# Patient Record
Sex: Female | Born: 1975 | Race: Black or African American | Hispanic: No | Marital: Single | Smoking: Never smoker
Health system: Southern US, Community
[De-identification: ages and names within clinical notes are randomized; demographics above are authoritative.]

## PROBLEM LIST (undated history)

## (undated) DIAGNOSIS — D649 Anemia, unspecified: Secondary | ICD-10-CM

## (undated) DIAGNOSIS — I10 Essential (primary) hypertension: Secondary | ICD-10-CM

## (undated) DIAGNOSIS — Q613 Polycystic kidney, unspecified: Secondary | ICD-10-CM

## (undated) HISTORY — PX: PATELLA RECONSTRUCTION: SHX736

---

## 2015-08-10 ENCOUNTER — Encounter: Payer: Self-pay | Admitting: Emergency Medicine

## 2015-08-10 ENCOUNTER — Ambulatory Visit
Admission: EM | Admit: 2015-08-10 | Discharge: 2015-08-10 | Disposition: A | Discharge status: Home / Self Care | Payer: BC Managed Care – PPO | Attending: Family Medicine | Admitting: Family Medicine

## 2015-08-10 DIAGNOSIS — L03116 Cellulitis of left lower limb: Secondary | ICD-10-CM

## 2015-08-10 DIAGNOSIS — T63441A Toxic effect of venom of bees, accidental (unintentional), initial encounter: Secondary | ICD-10-CM

## 2015-08-10 DIAGNOSIS — B070 Plantar wart: Secondary | ICD-10-CM

## 2015-08-10 HISTORY — DX: Essential (primary) hypertension: I10

## 2015-08-10 MED ORDER — MUPIROCIN CALCIUM 2 % EX CREA: 1.0000 "application " | TOPICAL_CREAM | Freq: Two times a day (BID) | CUTANEOUS | Status: AC

## 2015-08-10 MED ORDER — SULFAMETHOXAZOLE-TRIMETHOPRIM 800-160 MG PO TABS: 1.0000 | ORAL_TABLET | Freq: Two times a day (BID) | ORAL | Status: DC

## 2015-08-10 MED ORDER — ACETAMINOPHEN 500 MG PO TABS: 1000.0000 mg | ORAL_TABLET | Freq: Four times a day (QID) | ORAL | Status: DC | PRN

## 2015-08-10 MED ORDER — DIPHENHYDRAMINE HCL 50 MG PO CAPS: ORAL_CAPSULE | ORAL | Status: DC

## 2015-08-10 MED ORDER — SALICYLIC ACID 0.5 % EX LIQD: 2.0000 [drp] | Freq: Every day | CUTANEOUS | Status: AC

## 2015-08-10 MED ORDER — IBUPROFEN 800 MG PO TABS: 800.0000 mg | ORAL_TABLET | Freq: Three times a day (TID) | ORAL | Status: DC

## 2015-08-10 NOTE — Discharge Instructions (Signed)
Bee, Wasp, or [employer], wasps, and hornets are part of a family of insects that can sting people. These stings can cause pain and inflammation, but they are usually not serious. However, some people may have an allergic reaction to a sting. This can cause the symptoms to be more severe.  SYMPTOMS  Common symptoms of this condition include:   A red lump in the skin that sometimes has a tiny hole in the center. In some cases, a stinger may be in the center of the wound.  Pain and itching at the sting site.  Redness and swelling around the sting site. If you have an allergic reaction (localized allergic reaction), the swelling and redness may spread out from the sting site. In some cases, this reaction can continue to develop over the next 12-36 hours. In rare cases, a person may have a severe allergic reaction (anaphylactic reaction) to a sting. Symptoms of an anaphylactic reaction may include:   Wheezing or difficulty breathing.  Raised, itchy, red patches on the skin.  Nausea or vomiting.  Abdominal cramping.  Diarrhea.  Chest pain.  Fainting.  Redness of the face (flushing). DIAGNOSIS  This condition is usually diagnosed based on symptoms, medical history, and a physical exam. TREATMENT  Most stings can be treated with:   Icing to reduce swelling.  Medicines (antihistamines) to treat itching or an allergic reaction.  Medicines to help reduce pain. These may be medicines that you take by mouth, or medicated creams or lotions that you apply to your skin. If you were stung by a bee, the stinger and a small sac of poison may be in the wound. This may be removed by brushing across it with a flat card, such as a credit card. Another method is to pinch the area and pull it out. These methods can help reduce the severity of the body's reaction to the sting.  HOME CARE INSTRUCTIONS   Wash the sting site daily with soap and water as told by your health care provider.  Apply  or take over-the-counter and prescription medicines only as told by your health care provider.  If directed, apply ice to the sting area.  Put ice in a plastic bag.  Place a towel between your skin and the bag.  Leave the ice on for 20 minutes, 2-3 times per day.  Do not scratch the sting area.  To lessen pain, try using a paste that is made of water and baking soda. Rub the paste on the sting area and leave it on for 5 minutes.  If you had a severe allergic reaction to a sting, you may need:  To wear a medical bracelet or necklace that lists the allergy.  To learn when and how to use an anaphylaxis kit or epinephrine injection. Your family members may also need to learn this.  To carry an anaphylaxis kit with you at all times. SEEK MEDICAL CARE IF:   Your symptoms do not get better in 2-3 days.  You have redness, swelling, or pain that spreads beyond the area of the sting.  You have a fever. SEEK IMMEDIATE MEDICAL CARE IF:  You have symptoms of a severe allergic reaction. These include:   Wheezing or difficulty breathing.  Chest pain.  Light-headedness or fainting.  Itchy, raised, red patches on the skin.  Nausea or vomiting.  Abdominal cramping.  Diarrhea.   This information is not intended to replace advice given to you by your health care provider.  Make sure you discuss any questions you have with your health care provider.   Document Released: 10/03/2005 Document Revised: 06/24/2015 Document Reviewed: 02/18/2015 Elsevier Interactive Patient Education 2016 Elsevier Inc. Plantar Warts Warts are small growths on the skin. They can occur on various areas of the body. When they occur on the underside (sole) of the foot, they are called plantar warts. Plantar warts often occur in groups, with several small warts around a larger growth. They tend to develop over areas of pressure, such as the heel or the ball of the foot. Most warts are not painful, and they  usually do not cause problems. However, plantar warts may cause pain when you walk because pressure is applied to them. Warts often go away on their own in time. Various treatments may be done if needed. Sometimes, warts go away and then they come back again. CAUSES Plantar warts are caused by a type of virus that is called human papillomavirus (HPV). HPV attacks a break in the skin of the foot. Walking barefoot can lead to exposure to the virus. These warts may spread to other areas of the sole. They spread to other areas of the body only through direct contact. RISK FACTORS Plantar warts are more likely to develop in:  People who are 11-69 years of age.  People who use public showers or locker rooms.  People who have a weakened body defense system (immune system). SYMPTOMS Plantar warts may be flat or slightly raised. They may grow into the deeper layers of skin or rise above the surface of the skin. Most plantar warts have a rough surface. They may cause pain when you use your foot to support your body weight. DIAGNOSIS A plantar wart can usually be diagnosed from its appearance. In some cases, a tissue sample may be removed (biopsy) to be looked at under a microscope. TREATMENT In many cases, warts do not need treatment. Without treatment, they often go away over a period of many months to a couple years. If treatment is needed, options may include:  Applying medicated solutions, creams, or patches to the wart. These may be over-the-counter or prescription medicines that make the skin soft so that layers will gradually shed away. In many cases, the medicine is applied one or two times per day and covered with a bandage.  Putting duct tape over the top of the wart (occlusion). You will leave the tape in place for as long as told by your health care provider, then you will replace it with a new strip of tape. This is done until the wart goes away.  Freezing the wart with liquid nitrogen  (cryotherapy).  Burning the wart with:  Laser treatment.  An electrified probe (electrocautery).  Injection of a medicine (Candida antigen) into the wart to help the body's immune system to fight off the wart.  Surgery to remove the wart. HOME CARE INSTRUCTIONS  Apply medicated creams or solutions only as told by your health care provider. This may involve:  Soaking the affected area in warm water.  Removing the top layer of softened skin before you apply the medicine. A pumice stone works well for removing the tissue.  Applying a bandage over the affected area after you apply the medicine.  Repeating the process daily or as told by your health care provider.  Do not scratch or pick at a wart.  Wash your hands after you touch a wart.  If a wart is painful, try applying a bandage with  a hole in the middle over the wart. The helps to take pressure off the wart.  Keep all follow-up visits as told by your health care provider. This is important. PREVENTION Take these actions to help prevent warts:  Wear shoes and socks. Change your socks daily.  Keep your feet clean and dry.  Check your feet regularly.  Avoid direct contact with warts on other people. SEEK MEDICAL CARE IF:  Your warts do not improve after treatment.  You have redness, swelling, or pain at the site of a wart.  You have bleeding from a wart that does not stop with light pressure.  You have diabetes and you develop a wart.   This information is not intended to replace advice given to you by your health care provider. Make sure you discuss any questions you have with your health care provider.   Document Released: 12/24/2003 Document Revised: 06/24/2015 Document Reviewed: 12/29/2014 Elsevier Interactive Patient Education 2016 Elsevier Inc. Cellulitis Cellulitis is an infection of the skin and the tissue beneath it. The infected area is usually red and tender. Cellulitis occurs most often in the arms  and lower legs.  CAUSES  Cellulitis is caused by bacteria that enter the skin through cracks or cuts in the skin. The most common types of bacteria that cause cellulitis are staphylococci and streptococci. SIGNS AND SYMPTOMS   Redness and warmth.  Swelling.  Tenderness or pain.  Fever. DIAGNOSIS  Your health care provider can usually determine what is wrong based on a physical exam. Blood tests may also be done. TREATMENT  Treatment usually involves taking an antibiotic medicine. HOME CARE INSTRUCTIONS   Take your antibiotic medicine as directed by your health care provider. Finish the antibiotic even if you start to feel better.  Keep the infected arm or leg elevated to reduce swelling.  Apply a warm cloth to the affected area up to 4 times per day to relieve pain.  Take medicines only as directed by your health care provider.  Keep all follow-up visits as directed by your health care provider. SEEK MEDICAL CARE IF:   You notice red streaks coming from the infected area.  Your red area gets larger or turns dark in color.  Your bone or joint underneath the infected area becomes painful after the skin has healed.  Your infection returns in the same area or another area.  You notice a swollen bump in the infected area.  You develop new symptoms.  You have a fever. SEEK IMMEDIATE MEDICAL CARE IF:   You feel very sleepy.  You develop vomiting or diarrhea.  You have a general ill feeling (malaise) with muscle aches and pains.   This information is not intended to replace advice given to you by your health care provider. Make sure you discuss any questions you have with your health care provider.   Document Released: 07/13/2005 Document Revised: 06/24/2015 Document Reviewed: 12/19/2011 Elsevier Interactive Patient Education [employer].

## 2015-08-10 NOTE — ED Notes (Signed)
Patient states she has had a knot on her right foot for several weeks. Patient went to PCP and had it "frozen" 2 weeks ago but it seems to be  Getting worse.

## 2015-08-10 NOTE — ED Provider Notes (Signed)
CSN: [id number]     Arrival date & time 08/10/15  0820 History   First MD Initiated Contact with Patient 08/10/15 [phone]     Chief Complaint  Patient presents with  . 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  [facility] skin turned black and now swollen/red/painful to bear weight.  Tried a wart moleskin this weekend helped a little but redness extending sole right foot "feels like a knot" under skin.  Was stung by bee yesterday left forearm wants me to check if stinger gone it flew up her sleeve and she tried to shake it out but it stung her. Some swelling and redness noted by patient.  Denied allergy to iodine PSHx c-section x 4; M, F-HTN, F - colon cancer, PMHx HTN, leaky heart valve  The history is provided by the patient.    Past Medical History  Diagnosis Date  . Hypertension    Past Surgical History  Procedure Laterality Date  . Cesarean section classical      x 4    History reviewed. No pertinent family history. Social History  Substance Use Topics  . Smoking status: Never Smoker   . Smokeless tobacco: None  . Alcohol Use: No   OB History    No data available     Review of Systems  Constitutional: Negative for fever, chills, diaphoresis, activity change, appetite change, fatigue and unexpected weight change.  HENT: Negative for congestion, dental problem, drooling, ear discharge, ear pain, facial swelling, hearing loss, mouth sores, nosebleeds, postnasal drip, rhinorrhea, trouble swallowing and voice change.   Eyes: Negative for photophobia, pain, discharge, redness, itching and visual disturbance.  Respiratory: Negative for cough, choking, chest tightness, shortness of breath, wheezing and stridor.   Cardiovascular:  Negative for chest pain, palpitations and leg swelling.  Gastrointestinal: Negative for nausea, vomiting, abdominal pain, diarrhea, constipation, blood in stool and abdominal distention.  Endocrine: Negative for cold intolerance and heat intolerance.  Genitourinary: Negative for dysuria.  Musculoskeletal: Positive for myalgias. Negative for back pain, joint swelling, arthralgias, gait problem, neck pain and neck stiffness.  Skin: Positive for color change and rash. Negative for pallor and wound.  Allergic/Immunologic: Positive for food allergies. Negative for environmental allergies.  Neurological: Negative for dizziness, tremors, seizures, syncope, facial asymmetry, speech difficulty, weakness, light-headedness, numbness and headaches.  Hematological: Negative for adenopathy. Does not bruise/bleed easily.  Psychiatric/Behavioral: Negative for behavioral problems, confusion, sleep disturbance and agitation.    Allergies  Darvon; Penicillins; and Shellfish allergy  Home Medications   Prior to Admission medications   Medication Sig Start Date End Date Taking? Authorizing Provider  acetaminophen (TYLENOL) 500 MG tablet Take 2 tablets (1,000 mg total) by mouth every 6 (six) hours as needed for moderate pain. 08/10/15   Barbaraann Barthelina A Alfonza Toft, NP  diphenhydrAMINE (BENADRYL) 50 MG capsule 0.5 - 1 cap po TID prn itching 08/10/15   Barbaraann Barthelina A Kaydee Magel, NP  ibuprofen (ADVIL,MOTRIN) 800 MG tablet Take 1 tablet (800 mg total) by mouth 3 (three) times daily. 08/10/15   Barbaraann Barthelina A Salik Grewell, NP  mupirocin cream (BACTROBAN) 2 % Apply 1 application topically 2 (two) times daily. 08/10/15 08/17/15  Barbaraann Barthelina A Jayanth Szczesniak, NP  Salicylic Acid 0.5 % LIQD Apply 2 drops topically daily. 08/10/15 08/17/15  Barbaraann Barthelina A Kannen Moxey, NP  sulfamethoxazole-trimethoprim (BACTRIM DS,SEPTRA DS)  800-160 MG tablet Take 1 tablet by mouth 2 (two) times daily. 08/10/15   Barbaraann Barthel, NP   Meds Ordered and Administered this Visit   Medications - No data to display  BP 123/81 mmHg  Pulse 75  Temp(Src) 98.2 F (36.8 C) (Tympanic)  Resp 16  Ht  (1.626 m)  Wt 186 lb (84.369 kg)  BMI 31.91 kg/m2  SpO2 100%  LMP  (LMP Unknown) No data found.   Physical Exam  Constitutional: She is oriented to person, place, and time. Vital signs are normal. She appears well-developed and well-nourished. She is active and cooperative.  Non-toxic appearance. She does not have a sickly appearance. She does not appear ill. No distress.  HENT:  Head: Normocephalic and atraumatic.  Right Ear: Hearing and external ear normal.  Left Ear: Hearing and external ear normal.  Nose: Nose normal. No mucosal edema, rhinorrhea or nose lacerations.  Mouth/Throat: Uvula is midline, oropharynx is clear and moist and mucous membranes are normal. Mucous membranes are not pale, not dry and not cyanotic. She does not have dentures. No oral lesions. No trismus in the jaw. Normal dentition. No dental abscesses, uvula swelling, lacerations or dental caries. No oropharyngeal exudate, posterior oropharyngeal edema, posterior oropharyngeal erythema or tonsillar abscesses.  Eyes: Conjunctivae, EOM and lids are normal. Pupils are equal, round, and reactive to light. Right eye exhibits no chemosis, no discharge, no exudate and no hordeolum. No foreign body present in the right eye. Left eye exhibits no chemosis, no discharge, no exudate and no hordeolum. No foreign body present in the left eye. Right conjunctiva is not injected. Right conjunctiva has no hemorrhage. Left conjunctiva is not injected. Left conjunctiva has no hemorrhage. No scleral icterus. Right eye exhibits normal extraocular motion and no nystagmus. Left eye exhibits normal extraocular motion and no nystagmus. Right pupil is round and reactive. Left pupil is round and reactive. Pupils are equal.  Neck: Trachea normal and normal range of motion. Neck supple. No rigidity. No tracheal deviation, no edema,  no erythema and normal range of motion present. No thyromegaly present.  Cardiovascular: Normal rate, regular rhythm, normal heart sounds and intact distal pulses.  Exam reveals no gallop and no friction rub.   No murmur heard. Pulses:      Dorsalis pedis pulses are 2+ on the right side, and 2+ on the left side.  Pulmonary/Chest: Effort normal and breath sounds normal. No accessory muscle usage or stridor. No respiratory distress. She has no decreased breath sounds. She has no wheezes. She has no rhonchi. She has no rales. She exhibits no tenderness.  Abdominal: Soft. She exhibits no distension.  Musculoskeletal: Normal range of motion. She exhibits edema and tenderness.       Right knee: Normal.       Left knee: Normal.       Right ankle: Normal.       Left ankle: Normal.       Right lower leg: Normal.       Left lower leg: Normal.       Right foot: There is tenderness and swelling. There is normal range of motion, no bony tenderness, normal capillary refill, no crepitus, no deformity and no laceration.       Left foot: Normal.  Plantar MTP joint 1st with calloused area punctuate center and dark dots surrounded by 1cm erythema macular TTP right foot; erythematous area warm to touch  Neurological: She is alert and oriented to person, place, and  time. She displays no atrophy and no tremor. No cranial nerve deficit or sensory deficit. She exhibits normal muscle tone. She displays no seizure activity. Coordination and gait normal. GCS eye subscore is 4. GCS verbal subscore is 5. GCS motor subscore is 6.  Skin: Skin is warm, dry and intact. Rash noted. No abrasion, no bruising, no burn, no ecchymosis, no laceration, no lesion, no petechiae and no purpura noted. Rash is macular. Rash is not papular, not maculopapular, not nodular, not pustular, not vesicular and not urticarial. She is not diaphoretic. There is erythema. No cyanosis. No pallor. Nails show no clubbing.  Fine scale centrally with punctate  right plantar foot  Psychiatric: She has a normal mood and affect. Her speech is normal and behavior is normal. Judgment and thought content normal. Cognition and memory are normal.  Nursing note and vitals reviewed.   ED Course  Procedures (including critical care time)  Labs Review Labs Reviewed - No data to display  Imaging Review No results found.  0945 shaved down callous right foot with 10 blade after cleansing with iodine.  Stopped due to patient discomfort before capillary bleeding noted and triple antibiotic and bandaid applied.  Discussed Bandaid Change prn soiling and daily with shower/after work.  Patient verbalized understanding of information/instructions, agreed with plan of care and had no further questions at this time.     MDM   1. Bee sting, accidental or unintentional, initial encounter   2. Plantar wart of right foot   3. Cellulitis of left lower extremity   Benadryl 25-50mg  po pm prn breaththrough itching otherwise zyrtec  po qam.  Symptomatic therapy suggested.  Warm to cool water soaks or tepid showers.  Call or return to clinic as needed if these symptoms worsen or fail to improve as anticipated.  Go to ER if difficulty breathing, swallowing, dizziness or chest pain for immediate evaluation and treatment follow up with [facility] if ER visit required.   Discussed signs/symptoms infection e.g. Fever, chills, worsening redness, swelling.  Exitcare handout on insect bite/sting, cellulitis given to patient. Discussed how to remove hornet/bee stinger if sting reoccurs in future  Patient verbalized agreement and understanding of treatment plan and had no further questions at this time.   P2:  Avoidance and hand washing.  Work excuse x 24 hours due to cellulitis, elevate, ice tylenol  po QID prn.  Use OTC salacytic acid daily until resolved typically 1 week or follow up with PCM for repeat liquid nitrogen treatment discussed with patient we do not have liquid nitrogen.   Treated with shaving callous with #10 blade until pinpoint of blood noted and slight tenderness. Triple antibiotic and Band-Aid applied.  Patient reported minimal pain after procedure.  Discussed with patient to keep clean and dry and follow up in 7-10 days with PCM if wart growing larger or not resolved for cryotherapy/retreatment.  Discussed with patient 2-3 treatments may be required to eradicate wart.  Exitcare handout on warts given to patient. Patient notified to expect scab at treated area but if erythema, purulent drainage, red streak radiating up hand/foot to return to clinic for re-evaluation. Patient verbalized understanding of instructions/information and had no further questions at this time.  Had liquid nitrogen treatment at [facility] was wearing moleskin left foot now red, swollen TTP.  Exitcare handout on skin infection given to patient.  Start bactrim DS po BID and bactroban 2% apply affected area BID x 7 days.  RTC if worsening erythema, pain, purulent discharge, fever  despite antibiotics.  Wash towels, washcloths, sheets in hot water with bleach every couple of days until infection resolved.  Patient verbalized understanding, agreed with plan of care and had no further questions at this time.     Barbaraann Barthel, NP 08/10/15 1451

## 2015-09-22 ENCOUNTER — Emergency Department
Admission: EM | Admit: 2015-09-22 | Discharge: 2015-09-22 | Disposition: A | Discharge status: Home / Self Care | Payer: BC Managed Care – PPO | Attending: Emergency Medicine | Admitting: Emergency Medicine

## 2015-09-22 ENCOUNTER — Encounter: Payer: Self-pay | Admitting: Medical Oncology

## 2015-09-22 DIAGNOSIS — Z791 Long term (current) use of non-steroidal anti-inflammatories (NSAID): Secondary | ICD-10-CM | POA: Insufficient documentation

## 2015-09-22 DIAGNOSIS — B070 Plantar wart: Secondary | ICD-10-CM

## 2015-09-22 DIAGNOSIS — I10 Essential (primary) hypertension: Secondary | ICD-10-CM | POA: Diagnosis not present

## 2015-09-22 DIAGNOSIS — Z792 Long term (current) use of antibiotics: Secondary | ICD-10-CM | POA: Diagnosis not present

## 2015-09-22 DIAGNOSIS — M79671 Pain in right foot: Secondary | ICD-10-CM | POA: Diagnosis present

## 2015-09-22 DIAGNOSIS — Z880 Allergy status to penicillin: Secondary | ICD-10-CM | POA: Insufficient documentation

## 2015-09-22 NOTE — Discharge Instructions (Signed)
Plantar Warts Warts are small growths on the skin. They can occur on various areas of the body. When they occur on the underside (sole) of the foot, they are called plantar warts. Plantar warts often occur in groups, with several small warts around a larger growth. They tend to develop over areas of pressure, such as the heel or the ball of the foot. Most warts are not painful, and they usually do not cause problems. However, plantar warts may cause pain when you walk because pressure is applied to them. Warts often go away on their own in time. Various treatments may be done if needed. Sometimes, warts go away and then they come back again. CAUSES Plantar warts are caused by a type of virus that is called human papillomavirus (HPV). HPV attacks a break in the skin of the foot. Walking barefoot can lead to exposure to the virus. These warts may spread to other areas of the sole. They spread to other areas of the body only through direct contact. RISK FACTORS Plantar warts are more likely to develop in:  People who are 10-20 years of age.  People who use public showers or locker rooms.  People who have a weakened body defense system (immune system). SYMPTOMS Plantar warts may be flat or slightly raised. They may grow into the deeper layers of skin or rise above the surface of the skin. Most plantar warts have a rough surface. They may cause pain when you use your foot to support your body weight. DIAGNOSIS A plantar wart can usually be diagnosed from its appearance. In some cases, a tissue sample may be removed (biopsy) to be looked at under a microscope. TREATMENT In many cases, warts do not need treatment. Without treatment, they often go away over a period of many months to a couple years. If treatment is needed, options may include:  Applying medicated solutions, creams, or patches to the wart. These may be over-the-counter or prescription medicines that make the skin soft so that layers will  gradually shed away. In many cases, the medicine is applied one or two times per day and covered with a bandage.  Putting duct tape over the top of the wart (occlusion). You will leave the tape in place for as long as told by your health care provider, then you will replace it with a new strip of tape. This is done until the wart goes away.  Freezing the wart with liquid nitrogen (cryotherapy).  Burning the wart with:  Laser treatment.  An electrified probe (electrocautery).  Injection of a medicine (Candida antigen) into the wart to help the body's immune system to fight off the wart.  Surgery to remove the wart. HOME CARE INSTRUCTIONS  Apply medicated creams or solutions only as told by your health care provider. This may involve:  Soaking the affected area in warm water.  Removing the top layer of softened skin before you apply the medicine. A pumice stone works well for removing the tissue.  Applying a bandage over the affected area after you apply the medicine.  Repeating the process daily or as told by your health care provider.  Do not scratch or pick at a wart.  Wash your hands after you touch a wart.  If a wart is painful, try applying a bandage with a hole in the middle over the wart. The helps to take pressure off the wart.  Keep all follow-up visits as told by your health care provider. This is important. PREVENTION   Take these actions to help prevent warts:  Wear shoes and socks. Change your socks daily.  Keep your feet clean and dry.  Check your feet regularly.  Avoid direct contact with warts on other people. SEEK MEDICAL CARE IF:  Your warts do not improve after treatment.  You have redness, swelling, or pain at the site of a wart.  You have bleeding from a wart that does not stop with light pressure.  You have diabetes and you develop a wart.   This information is not intended to replace advice given to you by your health care provider. Make sure  you discuss any questions you have with your health care provider.   Document Released: 12/24/2003 Document Revised: 06/24/2015 Document Reviewed: 12/29/2014 Elsevier Interactive Patient Education 2016 [employer]  

## 2015-09-22 NOTE — ED Notes (Signed)
Pt reports she was treated for a wart on the bottom of her rt foot in October, shortly after pt was placed on abx for infection of wound, today pt reports she continues to have pain but no drainage noted. Denies fever. Pt pain is 7/10 without bearing weight. When bearing weight, pain is 10/10. Pt noted to have small placed on her right foot pad area with some swelling, no redness noted.

## 2015-09-22 NOTE — ED Notes (Signed)
Pt reports she was treated for a wart on the bottom of her rt foot in October, shortly after pt was placed on abx for infection of wound, today pt reports she continues to have pain but no drainage noted. Denies fever.

## 2015-09-22 NOTE — ED Provider Notes (Signed)
[facility] Emergency Department Provider Note  ____________________________________________  Time seen: Approximately 11:46 AM  I have reviewed the triage vital signs and the nursing notes.   HISTORY  Chief Complaint Foot Pain    HPI Sandia Short is a 39 y.o. female who presents emergency Department with a reoccurring plantar wart to the plantar aspect of right foot. She states that there was cryosurgery performed approximately a month ago and symptoms have returned. Patient is experiencing pain with direct pressure to area. She takes ibuprofen occasionally for symptom control. Patient states that she is unable to take ibuprofen every day due to her doctor's recommendation.   Past Medical History  Diagnosis Date  . Hypertension     There are no active problems to display for this patient.   Past Surgical History  Procedure Laterality Date  . Cesarean section classical      x 4     Current Outpatient Rx  Name  Route  Sig  Dispense  Refill  . acetaminophen (TYLENOL) 500 MG tablet   Oral   Take 2 tablets (1,000 mg total) by mouth every 6 (six) hours as needed for moderate pain.   30 tablet   0   . diphenhydrAMINE (BENADRYL) 50 MG capsule      0.5 - 1 cap po TID prn itching   30 capsule   0   . ibuprofen (ADVIL,MOTRIN) 800 MG tablet   Oral   Take 1 tablet (800 mg total) by mouth 3 (three) times daily.   21 tablet   0   . sulfamethoxazole-trimethoprim (BACTRIM DS,SEPTRA DS) 800-160 MG tablet   Oral   Take 1 tablet by mouth 2 (two) times daily.   14 tablet   0     Allergies Darvon; Penicillins; and Shellfish allergy  No family history on file.  Social History Social History  Substance Use Topics  . Smoking status: Never Smoker   . Smokeless tobacco: None  . Alcohol Use: No    Review of Systems Constitutional: No fever/chills Eyes: No visual changes. ENT: No sore throat. Cardiovascular: Denies chest pain. Respiratory:  Denies shortness of breath. Gastrointestinal: No abdominal pain.  No nausea, no vomiting.  No diarrhea.  No constipation. Genitourinary: Negative for dysuria. Musculoskeletal: Negative for back pain. Endorses right foot pain. Skin: Negative for rash. Neurological: Negative for headaches, focal weakness or numbness.  10-point ROS otherwise negative.  ____________________________________________   PHYSICAL EXAM:  VITAL SIGNS: ED Triage Vitals  Enc Vitals Group     BP 09/22/15 1010 133/95 mmHg     Pulse Rate 09/22/15 1010 66     Resp 09/22/15 1010 16     Temp 09/22/15 1010 98.3 F (36.8 C)     Temp Source 09/22/15 1010 Oral     SpO2 09/22/15 1010 99 %     Weight 09/22/15 1010 187 lb (84.823 kg)     Height 09/22/15 1010  (1.626 m)     Head Cir --      Peak Flow --      Pain Score 09/22/15 1012 7     Pain Loc --      Pain Edu? --      Excl. in GC? --     Constitutional: Alert and oriented. Well appearing and in no acute distress. Eyes: Conjunctivae are normal. PERRL. EOMI. Head: Atraumatic. Nose: No congestion/rhinnorhea. Mouth/Throat: Mucous membranes are moist.  Oropharynx non-erythematous. Neck: No stridor.   Cardiovascular: Normal rate, regular rhythm.  Grossly normal heart sounds.  Good peripheral circulation. Respiratory: Normal respiratory effort.  No retractions. Lungs CTAB. Gastrointestinal: Soft and nontender. No distention. No abdominal bruits. No CVA tenderness. Musculoskeletal: No lower extremity tenderness nor edema.  No joint effusions. Neurologic:  Normal speech and language. No gross focal neurologic deficits are appreciated. No gait instability. Skin:  Skin is warm, dry and intact. No rash noted. Plantar verruca is identified to the plantar aspect of right foot. It is visualized just proximal to the urgent second toe. Minor edema noted to area. No pustular drainage. No fluctuance. No other signs of infection. Psychiatric: Mood and affect are normal.  Speech and behavior are normal.  ____________________________________________   LABS (all labs ordered are listed, but only abnormal results are displayed)  Labs Reviewed - No data to display ____________________________________________  EKG   ____________________________________________  RADIOLOGY   ____________________________________________   PROCEDURES  Procedure(s) performed: None  Critical Care performed: No  ____________________________________________   INITIAL IMPRESSION / ASSESSMENT AND PLAN / ED COURSE  Pertinent labs & imaging results that were available during my care of the patient were reviewed by me and considered in my medical decision making (see chart for details).  Patient's diagnosis is consistent with plantar verruca to right foot. Advised patient of findings and diagnosis. Patient is to follow-up with Dr. Ether GriffinsFowler with podiatry for further treatment. Patient is advised to use ibuprofen as needed as well as to use moleskin around area for symptom control. Patient verbalizes understanding of diagnosis and treatment plan and verbalizes compliance with same.  __________________________________________   FINAL CLINICAL IMPRESSION(S) / ED DIAGNOSES  Final diagnoses:  Plantar verruca      Racheal PatchesJonathan D Nels Munn, PA-C 09/22/15 1203  Jene Everyobert Kinner, MD 09/22/15 [id number]

## 2015-10-23 ENCOUNTER — Encounter: Payer: Self-pay | Admitting: Emergency Medicine

## 2015-10-23 ENCOUNTER — Emergency Department
Admission: EM | Admit: 2015-10-23 | Discharge: 2015-10-23 | Disposition: A | Discharge status: Home / Self Care | Payer: BC Managed Care – PPO | Attending: Emergency Medicine | Admitting: Emergency Medicine

## 2015-10-23 DIAGNOSIS — M79671 Pain in right foot: Secondary | ICD-10-CM | POA: Diagnosis present

## 2015-10-23 DIAGNOSIS — Z880 Allergy status to penicillin: Secondary | ICD-10-CM | POA: Insufficient documentation

## 2015-10-23 DIAGNOSIS — I10 Essential (primary) hypertension: Secondary | ICD-10-CM | POA: Insufficient documentation

## 2015-10-23 DIAGNOSIS — Z79899 Other long term (current) drug therapy: Secondary | ICD-10-CM | POA: Diagnosis not present

## 2015-10-23 DIAGNOSIS — B070 Plantar wart: Secondary | ICD-10-CM | POA: Insufficient documentation

## 2015-10-23 NOTE — ED Notes (Signed)
Patient ambulatory to triage with steady gait, without difficulty or distress noted; pt st 12/28, had surface of plantar wart removed from right foot and was to return Wednesday for f/u to remove remaining wart; c/o pain to site with walking

## 2015-10-23 NOTE — ED Provider Notes (Signed)
[facility] Emergency Department Provider Note  ____________________________________________  Time seen: 805  I have reviewed the triage vital signs and the nursing notes.  History by:  Patient  HISTORY  Chief Complaint Foot Pain     HPI Gina Gardner is a 40 y.o. female who has had a plantar wart on the ball of her right foot for a couple of months. It was first treated in October by her primary physician. It is now under treatment by Dr. Ether GriffinsFowler, podiatry. On December 28, he removed part of the wart using an acid treatment. The patient reports that she has ongoing discomfort and pain in the area. She presents to the emergency department with frustration because of ongoing discomfort and difficulty walking. She reports she has to walk on the lateral portion of her foot. Now her lateral ankle is beginning to become sore. She is taking ibuprofen for this, but this is becoming less effective. She has called and spoken with Dr. Kari BaarsFelder's nurse who reported that the changes she is experiencing are normal. They reported to her that if she came in today, they would Bernarda CaffeySibley put her on an antibiotic. She has an appointment pending this coming Wednesday to see Dr. Ether GriffinsFowler again.   The patient, with her frustration, has presented to the emergency department. She reports she would like to feel to go back to work.     Past Medical History  Diagnosis Date  . Hypertension     There are no active problems to display for this patient.   Past Surgical History  Procedure Laterality Date  . Cesarean section classical      x 4     Current Outpatient Rx  Name  Route  Sig  Dispense  Refill  . ibuprofen (ADVIL,MOTRIN) 800 MG tablet   Oral   Take 1 tablet (800 mg total) by mouth 3 (three) times daily.   21 tablet   0   . acetaminophen (TYLENOL) 500 MG tablet   Oral   Take 2 tablets (1,000 mg total) by mouth every 6 (six) hours as needed for moderate pain.   30 tablet    0   . diphenhydrAMINE (BENADRYL) 50 MG capsule      0.5 - 1 cap po TID prn itching   30 capsule   0   . sulfamethoxazole-trimethoprim (BACTRIM DS,SEPTRA DS) 800-160 MG tablet   Oral   Take 1 tablet by mouth 2 (two) times daily.   14 tablet   0     Allergies Darvon; Penicillins; and Shellfish allergy  No family history on file.  Social History Social History  Substance Use Topics  . Smoking status: Never Smoker   . Smokeless tobacco: None  . Alcohol Use: No    Review of Systems  Constitutional: Negative for fever/chills. ENT: Negative for congestion. Cardiovascular: Negative for chest pain. Respiratory: Negative for cough. Gastrointestinal: Negative for abdominal pain, vomiting and diarrhea. Genitourinary: Negative for dysuria. Musculoskeletal: Foot pain, see history of present illness. Skin: Negative for rash. Neurological: Negative for headache or focal weakness   10-point ROS otherwise negative.  ____________________________________________   PHYSICAL EXAM:  VITAL SIGNS: ED Triage Vitals  Enc Vitals Group     BP 10/23/15 0654 137/90 mmHg     Pulse Rate 10/23/15 0654 67     Resp 10/23/15 0654 18     Temp 10/23/15 0654 98.1 F (36.7 C)     Temp Source 10/23/15 0654 Oral     SpO2  10/23/15 0654 100 %     Weight 10/23/15 0654 183 lb (83.008 kg)     Height 10/23/15 0654 5\' 4"  (1.626 m)     Head Cir --      Peak Flow --      Pain Score 10/23/15 0654 4     Pain Loc --      Pain Edu? --      Excl. in GC? --     Constitutional: Alert and oriented. Well appearing and in no distress. Cardiovascular: Normal rate, regular rhythm, no murmur noted Respiratory:  Normal respiratory effort, no tachypnea.    Breath sounds are clear and equal bilaterally.  Gastrointestinal: Soft, no distention. Nontender Musculoskeletal: Patient has a lesion on the ball of her right foot just proximal to her great toe. This area is a little bit inflamed with a white poor where  the plantar wart is. There is no erythema extending from this area. There is a little bit of swelling consistent with inflammation. It is possible there is a small amount of fluid underneath. The area is tender. Neurologic:  Communicative. Normal appearing spontaneous movement in all 4 extremities. No gross focal neurologic deficits are appreciated.  Skin:  Normal skin exam on gross examination, except for the lesion on the patient's right foot. See above for that description. Psychiatric: Mood and affect are normal. Speech and behavior are normal.  ____________________________________________   INITIAL IMPRESSION / ASSESSMENT AND PLAN / ED COURSE  Pertinent labs & imaging results that were available during my care of the patient were reviewed by me and considered in my medical decision making (see chart for details).  Overall pleasant 40 year old female with tenderness on the ball of her right foot due to an ongoing plantar wart that has only partially been removed. The patient appears frustrated that she is not able to ambulate better and go to work. She has soreness due to ambulating on the side of her foot. Overall, as noted by nurses in triage, the patient is able to ambulate adequately for her own daily activities, though this is likely inadequate for work. I have offered her crutches in order to reduce some of the pressure on the foot. She will accept those. I've offered antibiotics in case there is an infection present, although I think the changes we see her primarily inflammatory. She declines antibiotics for May reporting that she has antibiotics at home that she will begin. I have offered to call [facility] clinic to attempt to arrange for podiatry to see her today. She reports that she was told that she wouldn't not be evaluated today because she has an appointment on Wednesday. Overall, the patient appears frustrated but appears to be healing and a normal  progression.  ____________________________________________   FINAL CLINICAL IMPRESSION(S) / ED DIAGNOSES  Final diagnoses:  Plantar wart of right foot      Darien Ramus, MD 10/23/15 [phone]

## 2015-10-23 NOTE — ED Notes (Signed)
Pt states she had part of a wart on the bottom of her right foot removed, pt states the area is now tender and yellow and swollen, pt called MD office and was told to come in as a walk-in for evaluation and pt states she did not want to pay the co-pay, pt ambulatory to room, upon assessment area shows no redness

## 2015-10-23 NOTE — Discharge Instructions (Signed)
Please continue with your current treatment plan and follow with Dr. Ether Griffins as discussed. You may use crutches to reduce pressure on the foot and given further time to reduce inflammation and reduce pain. Continue ibuprofen. You may begin the antibiotics that we spoke of (Bactrim). If you have further redness or swelling that is spreading further up the foot, or give other urgent concerns return to the emergency department.  Plantar Warts Warts are small growths on the skin. They can occur on various areas of the body. When they occur on the underside (sole) of the foot, they are called plantar warts. Plantar warts often occur in groups, with several small warts around a larger growth. They tend to develop over areas of pressure, such as the heel or the ball of the foot. Most warts are not painful, and they usually do not cause problems. However, plantar warts may cause pain when you walk because pressure is applied to them. Warts often go away on their own in time. Various treatments may be done if needed. Sometimes, warts go away and then they come back again. CAUSES Plantar warts are caused by a type of virus that is called human papillomavirus (HPV). HPV attacks a break in the skin of the foot. Walking barefoot can lead to exposure to the virus. These warts may spread to other areas of the sole. They spread to other areas of the body only through direct contact. RISK FACTORS Plantar warts are more likely to develop in:  People who are 22-52 years of age.  People who use public showers or locker rooms.  People who have a weakened body defense system (immune system). SYMPTOMS Plantar warts may be flat or slightly raised. They may grow into the deeper layers of skin or rise above the surface of the skin. Most plantar warts have a rough surface. They may cause pain when you use your foot to support your body weight. DIAGNOSIS A plantar wart can usually be diagnosed from its appearance. In some  cases, a tissue sample may be removed (biopsy) to be looked at under a microscope. TREATMENT In many cases, warts do not need treatment. Without treatment, they often go away over a period of many months to a couple years. If treatment is needed, options may include:  Applying medicated solutions, creams, or patches to the wart. These may be over-the-counter or prescription medicines that make the skin soft so that layers will gradually shed away. In many cases, the medicine is applied one or two times per day and covered with a bandage.  Putting duct tape over the top of the wart (occlusion). You will leave the tape in place for as long as told by your health care provider, then you will replace it with a new strip of tape. This is done until the wart goes away.  Freezing the wart with liquid nitrogen (cryotherapy).  Burning the wart with:  Laser treatment.  An electrified probe (electrocautery).  Injection of a medicine (Candida antigen) into the wart to help the body's immune system to fight off the wart.  Surgery to remove the wart. HOME CARE INSTRUCTIONS  Apply medicated creams or solutions only as told by your health care provider. This may involve:  Soaking the affected area in warm water.  Removing the top layer of softened skin before you apply the medicine. A pumice stone works well for removing the tissue.  Applying a bandage over the affected area after you apply the medicine.  Repeating the  process daily or as told by your health care provider.  Do not scratch or pick at a wart.  Wash your hands after you touch a wart.  If a wart is painful, try applying a bandage with a hole in the middle over the wart. The helps to take pressure off the wart.  Keep all follow-up visits as told by your health care provider. This is important. PREVENTION Take these actions to help prevent warts:  Wear shoes and socks. Change your socks daily.  Keep your feet clean and  dry.  Check your feet regularly.  Avoid direct contact with warts on other people. SEEK MEDICAL CARE IF:  Your warts do not improve after treatment.  You have redness, swelling, or pain at the site of a wart.  You have bleeding from a wart that does not stop with light pressure.  You have diabetes and you develop a wart.   This information is not intended to replace advice given to you by your health care provider. Make sure you discuss any questions you have with your health care provider.   Document Released: 12/24/2003 Document Revised: 06/24/2015 Document Reviewed: 12/29/2014 Elsevier Interactive Patient Education [employer]2016 [employer].

## 2016-01-16 ENCOUNTER — Emergency Department
Admission: EM | Admit: 2016-01-16 | Discharge: 2016-01-16 | Disposition: A | Discharge status: Home / Self Care | Payer: BC Managed Care – PPO | Attending: Emergency Medicine | Admitting: Emergency Medicine

## 2016-01-16 ENCOUNTER — Encounter: Payer: Self-pay | Admitting: Emergency Medicine

## 2016-01-16 DIAGNOSIS — Z3202 Encounter for pregnancy test, result negative: Secondary | ICD-10-CM | POA: Insufficient documentation

## 2016-01-16 DIAGNOSIS — Z79899 Other long term (current) drug therapy: Secondary | ICD-10-CM | POA: Insufficient documentation

## 2016-01-16 DIAGNOSIS — Z792 Long term (current) use of antibiotics: Secondary | ICD-10-CM | POA: Insufficient documentation

## 2016-01-16 DIAGNOSIS — G4489 Other headache syndrome: Secondary | ICD-10-CM | POA: Diagnosis not present

## 2016-01-16 DIAGNOSIS — Z880 Allergy status to penicillin: Secondary | ICD-10-CM | POA: Insufficient documentation

## 2016-01-16 DIAGNOSIS — R51 Headache: Secondary | ICD-10-CM | POA: Diagnosis present

## 2016-01-16 DIAGNOSIS — I10 Essential (primary) hypertension: Secondary | ICD-10-CM | POA: Diagnosis not present

## 2016-01-16 DIAGNOSIS — Z791 Long term (current) use of non-steroidal anti-inflammatories (NSAID): Secondary | ICD-10-CM | POA: Insufficient documentation

## 2016-01-16 LAB — CBC WITH DIFFERENTIAL/PLATELET
BASOS ABS: 0 10*3/uL (ref 0–0.1)
Basophils Relative: 0 %
EOS PCT: 2 %
Eosinophils Absolute: 0.1 10*3/uL (ref 0–0.7)
HCT: 36.9 % (ref 35.0–47.0)
HEMOGLOBIN: 12.3 g/dL (ref 12.0–16.0)
Lymphocytes Relative: 27 %
Lymphs Abs: 2.2 10*3/uL (ref 1.0–3.6)
MCH: 28.3 pg (ref 26.0–34.0)
MCHC: 33.3 g/dL (ref 32.0–36.0)
MCV: 85.1 fL (ref 80.0–100.0)
Monocytes Absolute: 0.5 10*3/uL (ref 0.2–0.9)
Monocytes Relative: 6 %
NEUTROS PCT: 65 %
Neutro Abs: 5.1 10*3/uL (ref 1.4–6.5)
PLATELETS: 260 10*3/uL (ref 150–440)
RBC: 4.34 MIL/uL (ref 3.80–5.20)
RDW: 14.2 % (ref 11.5–14.5)
WBC: 7.9 10*3/uL (ref 3.6–11.0)

## 2016-01-16 LAB — BASIC METABOLIC PANEL
ANION GAP: 4 — AB (ref 5–15)
BUN: 12 mg/dL (ref 6–20)
CHLORIDE: 110 mmol/L (ref 101–111)
CO2: 26 mmol/L (ref 22–32)
Calcium: 8.8 mg/dL — ABNORMAL LOW (ref 8.9–10.3)
Creatinine, Ser: 0.99 mg/dL (ref 0.44–1.00)
GFR calc Af Amer: >60 mL/min (ref >60)
Glucose, Bld: 83 mg/dL (ref 65–99)
POTASSIUM: 3.7 mmol/L (ref 3.5–5.1)
SODIUM: 140 mmol/L (ref 135–145)

## 2016-01-16 LAB — POCT PREGNANCY, URINE: PREG TEST UR: NEGATIVE

## 2016-01-16 MED ORDER — MAGNESIUM SULFATE IN D5W 10-5 MG/ML-% IV SOLN
1.0000 g | Freq: Once | INTRAVENOUS | Status: DC
Start: 2016-01-16 — End: 2016-01-16
  Filled 2016-01-16: qty 100

## 2016-01-16 MED ORDER — SODIUM CHLORIDE 0.9 % IV BOLUS (SEPSIS)
1000.0000 mL | Freq: Once | INTRAVENOUS | Status: AC
  Administered 2016-01-16: 1000 mL via INTRAVENOUS

## 2016-01-16 MED ORDER — METHYLPREDNISOLONE SODIUM SUCC 125 MG IJ SOLR
125.0000 mg | Freq: Once | INTRAMUSCULAR | Status: AC
  Administered 2016-01-16: 125 mg via INTRAVENOUS
  Filled 2016-01-16: qty 2

## 2016-01-16 MED ORDER — KETOROLAC TROMETHAMINE 30 MG/ML IJ SOLN
15.0000 mg | Freq: Once | INTRAMUSCULAR | Status: DC
  Filled 2016-01-16: qty 1

## 2016-01-16 MED ORDER — KETOROLAC TROMETHAMINE 30 MG/ML IJ SOLN
15.0000 mg | Freq: Once | INTRAMUSCULAR | Status: AC
  Administered 2016-01-16: 15 mg via INTRAMUSCULAR

## 2016-01-16 MED ORDER — PROCHLORPERAZINE EDISYLATE 5 MG/ML IJ SOLN: 10.0000 mg | Freq: Once | INTRAMUSCULAR | Status: DC

## 2016-01-16 NOTE — Discharge Instructions (Signed)
Please seek medical attention for any high fevers, chest pain, shortness of breath, change in behavior, persistent vomiting, bloody stool or any other new or concerning symptoms. ° ° °General Headache Without Cause °A headache is pain or discomfort felt around the head or neck area. The specific cause of a headache may not be found. There are many causes and types of headaches. A few common ones are: °· Tension headaches. °· Migraine headaches. °· Cluster headaches. °· Chronic daily headaches. °HOME CARE INSTRUCTIONS  °Watch your condition for any changes. Take these steps to help with your condition: °Managing Pain °· Take over-the-counter and prescription medicines only as told by your health care provider. °· Lie down in a dark, quiet room when you have a headache. °· If directed, apply ice to the head and neck area: °¨ Put ice in a plastic bag. °¨ Place a towel between your skin and the bag. °¨ Leave the ice on for 20 minutes, 2-3 times per day. °· Use a heating pad or hot shower to apply heat to the head and neck area as told by your health care provider. °· Keep lights dim if bright lights bother you or make your headaches worse. °Eating and Drinking °· Eat meals on a regular schedule. °· Limit alcohol use. °· Decrease the amount of caffeine you drink, or stop drinking caffeine. °General Instructions °· Keep all follow-up visits as told by your health care provider. This is important. °· Keep a headache journal to help find out what may trigger your headaches. For example, write down: °¨ What you eat and drink. °¨ How much sleep you get. °¨ Any change to your diet or medicines. °· Try massage or other relaxation techniques. °· Limit stress. °· Sit up straight, and do not tense your muscles. °· Do not use tobacco products, including cigarettes, chewing tobacco, or e-cigarettes. If you need help quitting, ask your health care provider. °· Exercise regularly as told by your health care provider. °· Sleep on a  regular schedule. Get 7-9 hours of sleep, or the amount recommended by your health care provider. °SEEK MEDICAL CARE IF:  °· Your symptoms are not helped by medicine. °· You have a headache that is different from the usual headache. °· You have nausea or you vomit. °· You have a fever. °SEEK IMMEDIATE MEDICAL CARE IF:  °· Your headache becomes severe. °· You have repeated vomiting. °· You have a stiff neck. °· You have a loss of vision. °· You have problems with speech. °· You have pain in the eye or ear. °· You have muscular weakness or loss of muscle control. °· You lose your balance or have trouble walking. °· You feel faint or pass out. °· You have confusion. °  °This information is not intended to replace advice given to you by your health care provider. Make sure you discuss any questions you have with your health care provider. °  °Document Released: 10/03/2005 Document Revised: 06/24/2015 Document Reviewed: 01/26/2015 °Elsevier Interactive Patient Education ©2016 [employer] ° °

## 2016-01-16 NOTE — ED Provider Notes (Signed)
-----------------------------------------   8:30 AM on 01/16/2016 -----------------------------------------  Care was assumed Dr. Derrill KayGoodman at 7 AM pending labs and reassessment. Briefly this is a 40 year old female presenting with frontal headache which was a 2 out of 10 on arrival. She is receiving symptomatic treatment, basic labs are pending, reassess for disposition.  ----------------------------------------- 9:32 AM on 01/16/2016 ----------------------------------------- Patient reports that she feels much better at this time. She is sitting up in bed, texting on her Smart foam. CBC and BMP unremarkable. Negative pregnancy test. Discussed return precautions, need for close outpatient follow-up and she is comfortable with the discharge plan. DC home.   Gayla DossEryka A Sofya Moustafa, MD 01/16/16 [phone]

## 2016-01-16 NOTE — ED Notes (Signed)
Patient reports that she had a stress test done yesterday. After having the stress test she started to have a severe headache and her heart rate increased to 190 beats per minute. Patient states that her heart rate came down and she went to work last PM. Patient reports that while at work her heart rate went back up. Patient took 800 mg ibuprofen around 5 am. Patient reports that her headache is currently 2/10.

## 2016-01-16 NOTE — ED Notes (Signed)
Patient here with complaint of headache that started yesterday after having a stress test done yesterday.

## 2016-01-16 NOTE — ED Notes (Signed)
RN in room to administer medication. Patient stated that her IV site had "just started hurting". IV site shows no signs of infiltration and still had blood return but fluid was stopped and IV removed d/t patient c/o of pain.

## 2016-01-16 NOTE — ED Provider Notes (Signed)
[facility] Emergency Department Provider Note    ____________________________________________  Time seen: ~0620  I have reviewed the triage vital signs and the nursing notes.   HISTORY  Chief Complaint Headache   History limited by: Not Limited   HPI Gina Gardner is a 40 y.o. female who presents to the emergency department today because of headache. She describes it as being located across the front of her head. It started yesterday. She states it started after she underwent a stress test. Has been constant since then. She describes it as a dull pressure type pain. She did try taking NSAIDs which helped alleviate the pain somewhat. She states she has a history of migraines although this feels somewhat similar. She states she has undergone neuro imaging in the past with her migraines and that is been normal.     Past Medical History  Diagnosis Date  . Hypertension     There are no active problems to display for this patient.   Past Surgical History  Procedure Laterality Date  . Cesarean section classical      x 4     Current Outpatient Rx  Name  Route  Sig  Dispense  Refill  . acetaminophen (TYLENOL) 500 MG tablet   Oral   Take 2 tablets (1,000 mg total) by mouth every 6 (six) hours as needed for moderate pain.   30 tablet   0   . diphenhydrAMINE (BENADRYL) 50 MG capsule      0.5 - 1 cap po TID prn itching   30 capsule   0   . ibuprofen (ADVIL,MOTRIN) 800 MG tablet   Oral   Take 1 tablet (800 mg total) by mouth 3 (three) times daily.   21 tablet   0   . sulfamethoxazole-trimethoprim (BACTRIM DS,SEPTRA DS) 800-160 MG tablet   Oral   Take 1 tablet by mouth 2 (two) times daily.   14 tablet   0     Allergies Darvon; Penicillins; and Shellfish allergy  No family history on file.  Social History Social History  Substance Use Topics  . Smoking status: Never Smoker   . Smokeless tobacco: None  . Alcohol Use: No     Review of Systems  Constitutional: Negative for fever. Cardiovascular: Negative for chest pain. Respiratory: Negative for shortness of breath. Gastrointestinal: Negative for abdominal pain, vomiting and diarrhea. Neurological: Positive for headache   10-point ROS otherwise negative.  ____________________________________________   PHYSICAL EXAM:  VITAL SIGNS: ED Triage Vitals  Enc Vitals Group     BP 01/16/16 0556 127/88 mmHg     Pulse Rate 01/16/16 0556 72     Resp 01/16/16 0556 18     Temp 01/16/16 0556 98.5 F (36.9 C)     Temp Source 01/16/16 0556 Oral     SpO2 01/16/16 0556 100 %     Weight 01/16/16 0556 178 lb (80.74 kg)     Height 01/16/16 0556 5\' 4"  (1.626 m)     Head Cir --      Peak Flow --      Pain Score 01/16/16 0557 5   Constitutional: Alert and oriented. Well appearing and in no distress. Eyes: Conjunctivae are normal. PERRL. Normal extraocular movements. ENT   Head: Normocephalic and atraumatic.   Nose: No congestion/rhinnorhea.   Mouth/Throat: Mucous membranes are moist.   Neck: No stridor. Hematological/Lymphatic/Immunilogical: No cervical lymphadenopathy. Cardiovascular: Normal rate, regular rhythm.  No murmurs, rubs, or gallops. Respiratory: Normal respiratory effort without  tachypnea nor retractions. Breath sounds are clear and equal bilaterally. No wheezes/rales/rhonchi. Gastrointestinal: Soft and nontender. No distention. There is no CVA tenderness. Genitourinary: Deferred Musculoskeletal: Normal range of motion in all extremities. No joint effusions.  No lower extremity tenderness nor edema. Neurologic:  Normal speech and language. Strength 5 out of 5 in upper and lower extremities. Sensation grossly intact. Face symmetric. Ocular motions intact. PERRL. Finger to nose normal. No gross focal neurologic deficits are appreciated.  Skin:  Skin is warm, dry and intact. No rash noted. Psychiatric: Mood and affect are normal. Speech and  behavior are normal. Patient exhibits appropriate insight and judgment.  ____________________________________________    LABS (pertinent positives/negatives)  pending  ____________________________________________   EKG  None  ____________________________________________    RADIOLOGY  None   ____________________________________________   PROCEDURES  Procedure(s) performed: None  Critical Care performed: No  ____________________________________________   INITIAL IMPRESSION / ASSESSMENT AND PLAN / ED COURSE  Pertinent labs & imaging results that were available during my care of the patient were reviewed by me and considered in my medical decision making (see chart for details).  Patient presents with headache. No focal neuro findings on exam. Patient has had negative neuroimaging in the past. Do not think emergent neuroimaging required at this time. Will check basic labs, give IVFs and medication.  ____________________________________________   FINAL CLINICAL IMPRESSION(S) / ED DIAGNOSES  Final diagnoses:  Other headache syndrome     Phineas Semen, MD 01/16/16 [phone]

## 2019-08-30 DIAGNOSIS — S8392XA Sprain of unspecified site of left knee, initial encounter: Secondary | ICD-10-CM | POA: Diagnosis not present

## 2019-08-30 DIAGNOSIS — S39012A Strain of muscle, fascia and tendon of lower back, initial encounter: Secondary | ICD-10-CM | POA: Diagnosis not present

## 2019-10-30 DIAGNOSIS — Z20828 Contact with and (suspected) exposure to other viral communicable diseases: Secondary | ICD-10-CM | POA: Diagnosis not present

## 2020-02-25 DIAGNOSIS — N970 Female infertility associated with anovulation: Secondary | ICD-10-CM | POA: Diagnosis not present

## 2020-02-25 DIAGNOSIS — Z124 Encounter for screening for malignant neoplasm of cervix: Secondary | ICD-10-CM | POA: Diagnosis not present

## 2020-02-25 DIAGNOSIS — Z0000 Encounter for general adult medical examination without abnormal findings: Secondary | ICD-10-CM | POA: Diagnosis not present

## 2020-02-25 DIAGNOSIS — Z1159 Encounter for screening for other viral diseases: Secondary | ICD-10-CM | POA: Diagnosis not present

## 2020-02-25 DIAGNOSIS — Z1231 Encounter for screening mammogram for malignant neoplasm of breast: Secondary | ICD-10-CM | POA: Diagnosis not present

## 2020-02-25 DIAGNOSIS — E049 Nontoxic goiter, unspecified: Secondary | ICD-10-CM | POA: Diagnosis not present

## 2020-04-16 DIAGNOSIS — Z419 Encounter for procedure for purposes other than remedying health state, unspecified: Secondary | ICD-10-CM | POA: Diagnosis not present

## 2020-05-17 DIAGNOSIS — Z419 Encounter for procedure for purposes other than remedying health state, unspecified: Secondary | ICD-10-CM | POA: Diagnosis not present

## 2020-09-01 ENCOUNTER — Encounter: Payer: Self-pay | Admitting: Obstetrics and Gynecology

## 2020-09-01 ENCOUNTER — Other Ambulatory Visit: Payer: Self-pay

## 2020-09-01 ENCOUNTER — Ambulatory Visit (INDEPENDENT_AMBULATORY_CARE_PROVIDER_SITE_OTHER): Payer: Managed Care, Other (non HMO) | Admitting: Obstetrics and Gynecology

## 2020-09-01 VITALS — BP 122/68 | Ht 63.5 in | Wt 182.0 lb

## 2020-09-01 DIAGNOSIS — N7011 Chronic salpingitis: Secondary | ICD-10-CM | POA: Diagnosis not present

## 2020-09-01 NOTE — Progress Notes (Signed)
Obstetrics & Gynecology Office Visit   Chief Complaint: No chief complaint on file.   History of Present Illness: 44 y.o. [id number] presenting for evaluation of right hydrosalpinx on recent HSG.  Images reviewed showing dilation of the left fallopian tube and normal caliber but no spill on the right.  The conceived successfully on clomid previously following HSG which prompted her getting this HSG.  Menstrual cycles are regular monthly. Pap smears are up to date.  The patient has no known history of PID, she does have a history of prior cesarean section x 4 including a classical C-section.  Otherwise no history of pelvic surgery.  She is asymptomatic .  The patient did bring images from her procedure which were reviewed today   Review of Systems: Review of Systems  Constitutional: Negative.   Gastrointestinal: Negative.   Genitourinary: Negative.      Past Medical History:  Past Medical History:  Diagnosis Date  . Hypertension     Past Surgical History:  Past Surgical History:  Procedure Laterality Date  . CESAREAN SECTION    . CESAREAN SECTION CLASSICAL     x 4     Gynecologic History: Patient's last menstrual period was 08/21/2020 (approximate).  Obstetric History: [id number]  Family History:  Family History  Problem Relation Age of Onset  . Diabetes Mother   . Hypertension Mother   . Cancer Father     Social History:  Social History   Socioeconomic History  . Marital status: Single    Spouse name: Not on file  . Number of children: Not on file  . Years of education: Not on file  . Highest education level: Not on file  Occupational History  . Not on file  Tobacco Use  . Smoking status: Never Smoker  . Smokeless tobacco: Never Used  Vaping Use  . Vaping Use: Never used  Substance and Sexual Activity  . Alcohol use: No  . Drug use: Never  . Sexual activity: Yes    Birth control/protection: Condom  Other Topics Concern  . Not on file  Social History  Narrative  . Not on file   Social Determinants of Health   Financial Resource Strain:   . Difficulty of Paying Living Expenses: Not on file  Food Insecurity:   . Worried About Programme researcher, broadcasting/film/video in the Last Year: Not on file  . Ran Out of Food in the Last Year: Not on file  Transportation Needs:   . Lack of Transportation (Medical): Not on file  . Lack of Transportation (Non-Medical): Not on file  Physical Activity:   . Days of Exercise per Week: Not on file  . Minutes of Exercise per Session: Not on file  Stress:   . Feeling of Stress : Not on file  Social Connections:   . Frequency of Communication with Friends and Family: Not on file  . Frequency of Social Gatherings with Friends and Family: Not on file  . Attends Religious Services: Not on file  . Active Member of Clubs or Organizations: Not on file  . Attends Banker Meetings: Not on file  . Marital Status: Not on file  Intimate Partner Violence:   . Fear of Current or Ex-Partner: Not on file  . Emotionally Abused: Not on file  . Physically Abused: Not on file  . Sexually Abused: Not on file    Allergies:  Allergies  Allergen Reactions  . Darvon [Propoxyphene]   . Penicillins       .  Shellfish Allergy     Medications: Prior to Admission medications   Medication Sig Start Date End Date Taking? Authorizing Provider  acetaminophen (TYLENOL) 500 MG tablet Take 2 tablets (1,000 mg total) by mouth every 6 (six) hours as needed for moderate pain. Patient not taking: Reported on 09/01/2020 08/10/15   Barbaraann Barthel, NP  diphenhydrAMINE (BENADRYL) 50 MG capsule 0.5 - 1 cap po TID prn itching Patient not taking: Reported on 09/01/2020 08/10/15   Barbaraann Barthel, NP  ibuprofen (ADVIL,MOTRIN) 800 MG tablet Take 1 tablet (800 mg total) by mouth 3 (three) times daily. Patient not taking: Reported on 09/01/2020 08/10/15   Barbaraann Barthel, NP  sulfamethoxazole-trimethoprim (BACTRIM DS,SEPTRA DS) 800-160  MG tablet Take 1 tablet by mouth 2 (two) times daily. Patient not taking: Reported on 09/01/2020 08/10/15   Barbaraann Barthel, NP    Physical Exam Vitals:  Vitals:   09/01/20 0858  BP: 122/68   Patient's last menstrual period was 08/21/2020 (approximate).  General: NAD, well nourished appears stated age HEENT: normocephalic, anicteric Pulmonary: No increased work of breathing Neurologic: Grossly intact Psychiatric: mood appropriate, affect full  Female chaperone present for pelvic  portions of the physical exam  Assessment: 44 y.o. [id number] left hydrosalpinx  Plan: Problem List Items Addressed This Visit    None    Visit Diagnoses    Hydrosalpinx    -  Primary     1) Hydrosalpinx - desires removal.  Discussed that this is likely a chronic finding and ha no significant health implications but may have fertility implication as well as increasing risk of ectopic pregnancy.  Will post for laproscopic salpingectomy and chromopertubation wants conservation of left tube even if no spill or if has scarring.  Chances of spontaneous conception and live birth at age 31 are low.  Using IVF data the chances of successful conception and delivery are approximately 5%.     2) A total of 15 minutes were spent in face-to-face contact with the patient during this encounter with over half of that time devoted to counseling and coordination of care.   Prior imaging was personally reviwed  3) Return if symptoms worsen or fail to improve.   Vena Austria, MD, [facility], [facility]

## 2020-09-01 NOTE — Progress Notes (Signed)
NP, Inflamed ovaries

## 2020-09-09 ENCOUNTER — Telehealth: Payer: Self-pay | Admitting: Obstetrics and Gynecology

## 2020-09-09 NOTE — Telephone Encounter (Signed)
Pt rtn'd call and was hesitant. She adv that she had more questions. She adv that she was under the impression that her ovary would be removed and she didn't want to do that based on her own "research" she did after her appt.  I adv that the surgical request is for a salpingectomy, removal of the tube. She had several questions regarding her case and I adv that I was not clinical and that I would let Dr Bonney Aid know that she has things she would like to discuss. She was questioning/unsure of which side was to be addressed in the surgery.  I adv that our office is closed 11/25 & 26 for Thanksgiving. I adv that Dr Bonney Aid is on call over the holiday and should see my notes. He is not in clinic 11/29 and in OR 11/30.   I did give her dates that I had to offer and she adv the 12/21 would be a good day for her. I will make note of it and wait to create a case until after Bonney Aid has addressed pt questions.

## 2020-09-09 NOTE — Telephone Encounter (Signed)
Left a message for the patient to return the call.  

## 2020-09-15 ENCOUNTER — Telehealth: Payer: Self-pay | Admitting: Obstetrics and Gynecology

## 2020-09-15 NOTE — Telephone Encounter (Signed)
-----   Message from Vena Austria, MD sent at 09/04/2020  1:54 AM EST ----- Regarding: Surgery Surgery Booking Request Patient Full Name:  Gina Gardner  MRN: [id number]  DOB: 11-Nov-1975  Surgeon: Vena Austria, MD  Requested Surgery Date and Time: 2-4 weeks Primary Diagnosis AND Code: N70.1 Secondary Diagnosis and Code:  Surgical Procedure: laproscopic right salpingectomy and chromopertubation RNFA Requested?: No L&D Notification: No Admission Status: same day surgery Length of Surgery: 50 min Special Case Needs: No H&P: Yes Phone Interview???:  Yes Interpreter: No Medical Clearance:  No Special Scheduling Instructions: No Any known health/anesthesia issues, diabetes, sleep apnea, latex allergy, defibrillator/pacemaker?: No Acuity: P3   (P1 highest, P2 delay may cause harm, P3 low, elective gyn, P4 lowest)

## 2020-09-15 NOTE — Telephone Encounter (Signed)
Called patient and confirmed that she read her my chart msg from [facility] and to schedule her for laparoscopic right salpingectomy and chromopertubation w Staebler  DOS 10/22/20  H&P 12/28 @ 8:10    Covid testing 10/20/20 @ 8-10:30, [address], drive up and wear mask. Advised pt to quarantine until DOS.  Pre-admit phone call appointment to be requested - date and time will be included on H&P paper work. Also all appointments will be updated on pt MyChart. Explained that this appointment has a call window. Based on the time scheduled will indicate if the call will be received within a 4 hour window before 1:00 or after.  Advised that pt may also receive calls from the hospital pharmacy and pre-service center.  Confirmed pt has [place] as Editor, commissioning. She also has [employer] as Social research officer, government - she adv that she didn't realize she had this and has only used it one time. I adv that it is e-verified and that the coverage is no longer active, her account will reflect it.

## 2020-10-13 ENCOUNTER — Encounter: Payer: Self-pay | Admitting: Obstetrics and Gynecology

## 2020-10-13 ENCOUNTER — Other Ambulatory Visit: Payer: Self-pay

## 2020-10-13 ENCOUNTER — Ambulatory Visit (INDEPENDENT_AMBULATORY_CARE_PROVIDER_SITE_OTHER): Payer: Managed Care, Other (non HMO) | Admitting: Obstetrics and Gynecology

## 2020-10-13 VITALS — BP 126/82 | Ht 63.5 in | Wt 180.0 lb

## 2020-10-13 DIAGNOSIS — Z01818 Encounter for other preprocedural examination: Secondary | ICD-10-CM

## 2020-10-13 DIAGNOSIS — N7011 Chronic salpingitis: Secondary | ICD-10-CM

## 2020-10-13 NOTE — Progress Notes (Signed)
Obstetrics & Gynecology Surgery H&P    Chief Complaint: Scheduled Surgery   History of Present Illness: Patient is a 44 y.o. [id number] presenting for scheduled laparoscopic LEFT salpingectomy, for the treatment or further evaluation of hydrosalpinx.   Prior Treatments prior to proceeding with surgery include: HSG imaging  Preoperative Pap: 02/25/2020 NILM Preop HSG - normal caliber right tube, dilated left fallopian tube no spill   Review of Systems:10 point review of systems  Past Medical History:  There are no problems to display for this patient.   Past Surgical History:  Past Surgical History:  Procedure Laterality Date  . CESAREAN SECTION    . CESAREAN SECTION CLASSICAL     x 4     Family History:  Family History  Problem Relation Age of Onset  . Diabetes Mother   . Hypertension Mother   . Cancer Father     Social History:  Social History   Socioeconomic History  . Marital status: Single    Spouse name: Not on file  . Number of children: Not on file  . Years of education: Not on file  . Highest education level: Not on file  Occupational History  . Not on file  Tobacco Use  . Smoking status: Never Smoker  . Smokeless tobacco: Never Used  Vaping Use  . Vaping Use: Never used  Substance and Sexual Activity  . Alcohol use: No  . Drug use: Never  . Sexual activity: Yes    Birth control/protection: Condom  Other Topics Concern  . Not on file  Social History Narrative  . Not on file   Social Determinants of Health   Financial Resource Strain: Not on file  Food Insecurity: Not on file  Transportation Needs: Not on file  Physical Activity: Not on file  Stress: Not on file  Social Connections: Not on file  Intimate Partner Violence: Not on file    Allergies:  Allergies  Allergen Reactions  . Darvon [Propoxyphene] Hives and Itching    Darvocet-N  . Penicillins     then may proceed with Cephalosporin use.   . Shellfish Allergy Hives     Medications: Prior to Admission medications   Medication Sig Start Date End Date Taking? Authorizing Provider  acetaminophen (TYLENOL) 500 MG tablet Take 500-1,000 mg by mouth every 6 (six) hours as needed (pain.).   Yes [provider]  Biotin 5000 MCG TABS Take 5,000 mcg by mouth daily.   Yes [provider]  clindamycin (CLEOCIN) 300 MG capsule Take 300 mg by mouth 4 (four) times daily.   Yes [provider]  Docosahexaenoic Acid (DOC OMEGA PO) Take 1 capsule by mouth daily. Omega-7   Yes [provider]    Physical Exam Vitals: Blood pressure 126/82, height 5' 3.5" (1.613 m), weight 180 lb (81.6 kg), last menstrual period 10/12/2020. General: NAD HEENT: normocephalic, anicteric Pulmonary: No increased work of breathing, CTAB Cardiovascular: RRR, distal pulses 2+ Abdomen: soft, non-tender, non-distended Extremities: no edema, erythema, or tenderness Neurologic: Grossly intact Psychiatric: mood appropriate, affect full  Imaging No results found.  Assessment: 43 y.o. [id number] presenting for scheduled laparoscopic LEFT salpingectomy, chrompertubation  Plan: 1) I have had a careful discussion with this patient about all the options available and the risk/benefits of each. I have fully informed this patient that a laparoscopy may subject her to a variety of discomforts and risks: She understands that most patients have surgery with little difficulty, but problems can happen ranging from minor  to fatal. These include nausea, vomiting, pain, bleeding, infection, poor healing, hernia, or formation of adhesions. Unexpected reactions may occur from any drug or anesthetic given. Unintended injury may occur to other pelvic or abdominal structures such as Fallopian tubes, ovaries, bladder, ureter (tube from kidney to bladder), or bowel. Nerves going from the pelvis to the legs may be injured. Any such injury may require immediate or later additional surgery to  correct the problem. Excessive blood loss requiring transfusion is very unlikely but possible. Dangerous blood clots may form in the legs or lungs. Physical and sexual activity will be restricted in varying degrees for an indeterminate period of time but most often 2-4 weeks. She understands that the plan is to do this laparoscopically, however, there is a chance that this will need to be performed via a larger incision. Finally, she understands that it is impossible to list every possible undesirable effect and that the condition for which surgery is done is not always cured or significantly improved, and in rare cases may be even worsen. Ample time was given to answer all questions. - patient wishes conservation of left tube even if no spill on chromopertubation  2) Routine postoperative instructions were reviewed with the patient and her family in detail today including the expected length of recovery and likely postoperative course.  The patient concurred with the proposed plan, giving informed written consent for the surgery today.  Patient instructed on the importance of being NPO after midnight prior to her procedure.  If warranted preoperative prophylactic antibiotics and SCDs ordered on call to the OR to meet SCIP guidelines and adhere to recommendation laid forth in ACOG Practice Bulletin Number 104 May 2009  "Antibiotic Prophylaxis for Gynecologic Procedures".     Vena Austria, MD, [facility], [facility] 10/13/2020, 8:43 AM

## 2020-10-14 ENCOUNTER — Encounter
Admission: RE | Admit: 2020-10-14 | Discharge: 2020-10-14 | Disposition: A | Discharge status: Home / Self Care | Payer: Managed Care, Other (non HMO) | Source: Ambulatory Visit | Attending: Obstetrics and Gynecology | Admitting: Obstetrics and Gynecology

## 2020-10-14 ENCOUNTER — Other Ambulatory Visit: Payer: Self-pay

## 2020-10-14 HISTORY — DX: Anemia, unspecified: D64.9

## 2020-10-14 HISTORY — DX: Polycystic kidney, unspecified: Q61.3

## 2020-10-14 NOTE — Patient Instructions (Signed)
Your procedure is scheduled on: 10/23/19 Report to DAY SURGERY DEPARTMENT LOCATED ON 2ND FLOOR MEDICAL MALL ENTRANCE. To find out your arrival time please call [phone] between 1PM - 3PM on 10/22/19.  Remember: Instructions that are not followed completely may result in serious medical risk, up to and including death, or upon the discretion of your surgeon and anesthesiologist your surgery may need to be rescheduled.     _X__ 1. Do not eat food after midnight the night before your procedure.                 No gum chewing or hard candies. You may drink clear liquids up to 2 hours                 before you are scheduled to arrive for your surgery- DO not drink clear                 liquids within 2 hours of the start of your surgery.                 Clear Liquids include:  water, apple juice without pulp, clear carbohydrate                 drink such as Clearfast or Gatorade, Black Coffee or Tea (Do not add                 anything to coffee or tea). Diabetics water only  __X__2.  On the morning of surgery brush your teeth with toothpaste and water, you                 may rinse your mouth with mouthwash if you wish.  Do not swallow any              toothpaste of mouthwash.     _X__ 3.  No Alcohol for 24 hours before or after surgery.   _X__ 4.  Do Not Smoke or use e-cigarettes For 24 Hours Prior to Your Surgery.                 Do not use any chewable tobacco products for at least 6 hours prior to                 surgery.  ____  5.  Bring all medications with you on the day of surgery if instructed.   __X__  6.  Notify your doctor if there is any change in your medical condition      (cold, fever, infections).     Do not wear jewelry, make-up, hairpins, clips or nail polish. Do not wear lotions, powders, or perfumes.  Do not shave 48 hours prior to surgery. Men may shave face and neck. Do not bring valuables to the hospital.    [facility] is not responsible for any belongings or  valuables.  Contacts, dentures/partials or body piercings may not be worn into surgery. Bring a case for your contacts, glasses or hearing aids, a denture cup will be supplied. Leave your suitcase in the car. After surgery it may be brought to your room. For patients admitted to the hospital, discharge time is determined by your treatment team.   Patients discharged the day of surgery will not be allowed to drive home.   Please read over the following fact sheets that you were given:   MRSA Information  __X__ Take these medicines the morning of surgery with A SIP OF WATER:  1. none  2.   3.   4.  5.  6.  ____ Fleet Enema (as directed)   __X__ Use CHG Soap/SAGE wipes as directed  ____ Use inhalers on the day of surgery  ____ Stop metformin/Janumet/Farxiga 2 days prior to surgery    ____ Take 1/2 of usual insulin dose the night before surgery. No insulin the morning          of surgery.   ____ Stop Blood Thinners Coumadin/Plavix/Xarelto/Pleta/Pradaxa/Eliquis/Effient/Aspirin  on   Or contact your Surgeon, Cardiologist or Medical Doctor regarding  ability to stop your blood thinners  __X__ Stop Anti-inflammatories 7 days before surgery such as Advil, Ibuprofen, Motrin,  BC or Goodies Powder, Naprosyn, Naproxen, Aleve, Aspirin    __X__ Stop all herbal supplements, fish oil or vitamin E until after surgery.    ____ Bring C-Pap to the hospital.

## 2020-10-20 ENCOUNTER — Other Ambulatory Visit
Admission: RE | Admit: 2020-10-20 | Discharge: 2020-10-20 | Disposition: A | Discharge status: Home / Self Care | Payer: Managed Care, Other (non HMO) | Source: Ambulatory Visit | Attending: Obstetrics and Gynecology | Admitting: Obstetrics and Gynecology

## 2020-10-20 ENCOUNTER — Encounter: Payer: Self-pay | Admitting: Urgent Care

## 2020-10-20 ENCOUNTER — Other Ambulatory Visit: Payer: Self-pay

## 2020-10-20 ENCOUNTER — Telehealth: Payer: Self-pay | Admitting: Obstetrics and Gynecology

## 2020-10-20 DIAGNOSIS — Z01818 Encounter for other preprocedural examination: Secondary | ICD-10-CM | POA: Diagnosis present

## 2020-10-20 DIAGNOSIS — U071 COVID-19: Secondary | ICD-10-CM | POA: Insufficient documentation

## 2020-10-20 LAB — TYPE AND SCREEN
ABO/RH(D): B POS
Antibody Screen: NEGATIVE

## 2020-10-20 NOTE — Telephone Encounter (Signed)
Please advise 

## 2020-10-20 NOTE — Telephone Encounter (Signed)
Patient is calling to follow up on Work note for her surgery coming up with AMS. Please advise

## 2020-10-21 ENCOUNTER — Encounter: Payer: Self-pay | Admitting: Obstetrics and Gynecology

## 2020-10-21 LAB — SARS CORONAVIRUS 2 (TAT 6-24 HRS): SARS Coronavirus 2: POSITIVE — AB

## 2020-10-21 NOTE — Telephone Encounter (Signed)
I have a letter in her chart with the CDC quarantine guidelines.

## 2020-10-22 ENCOUNTER — Telehealth: Payer: Self-pay | Admitting: Obstetrics and Gynecology

## 2020-10-22 ENCOUNTER — Encounter: Admission: RE | Payer: Self-pay | Source: Home / Self Care

## 2020-10-22 ENCOUNTER — Ambulatory Visit
Admission: RE | Admit: 2020-10-22 | Payer: Managed Care, Other (non HMO) | Source: Home / Self Care | Admitting: Obstetrics and Gynecology

## 2020-10-22 SURGERY — SALPINGECTOMY, UNILATERAL, LAPAROSCOPIC
Anesthesia: Choice

## 2020-10-26 ENCOUNTER — Telehealth: Payer: Self-pay | Admitting: Obstetrics and Gynecology

## 2020-10-26 NOTE — Telephone Encounter (Signed)
Spoke to the patient to reschedule her surgery. Patient wants to reschedule in February to give her time to get over Covid. Patient chose Thursday, 12/03/20 OR date. Unable to pull past case onto the OR grid. Secure chat to [employer]. L/m for Teena Dunk @ Pre-admit to be sure nothing needs repeating prior to 2/17 surgery.

## 2020-10-28 ENCOUNTER — Telehealth: Payer: Self-pay | Admitting: Obstetrics and Gynecology

## 2020-10-28 NOTE — Telephone Encounter (Signed)
Called pt to confirm rescheduling of DOS w [facility].  New DOS 12/03/20  H&P was done on 10/13/20 - new signatures will be needed from pt. I adv her I will ask Bonney Aid if he is ok with obtaining those on DOS.  AMS pls adv.

## 2020-10-28 NOTE — Telephone Encounter (Signed)
LMFPRC 

## 2020-10-29 ENCOUNTER — Ambulatory Visit: Payer: Managed Care, Other (non HMO) | Admitting: Obstetrics and Gynecology

## 2020-11-16 ENCOUNTER — Telehealth: Payer: Self-pay

## 2020-11-16 NOTE — Telephone Encounter (Signed)
Pt called and wants to cancel surgery scheduled for 2/17. She adv that she has been at her current job for less that a year and is unable to work out time away.  She would still like to have "the test where dye is injected in her tubes to check for blockages". I adv that I will send her request to Dr Bonney Aid and that she will hear back from [place] to adv about her request.

## 2020-11-17 NOTE — Telephone Encounter (Signed)
So we have no reason to repeat that test given the findings on the initial test.  These blockages don't spontaneously resolve

## 2020-12-03 ENCOUNTER — Ambulatory Visit: Admit: 2020-12-03 | Payer: Managed Care, Other (non HMO) | Admitting: Obstetrics and Gynecology

## 2020-12-03 SURGERY — SALPINGECTOMY, UNILATERAL, LAPAROSCOPIC
Anesthesia: Choice

## 2020-12-11 ENCOUNTER — Ambulatory Visit: Payer: Managed Care, Other (non HMO) | Admitting: Obstetrics and Gynecology

## 2020-12-21 ENCOUNTER — Telehealth: Payer: Self-pay

## 2020-12-21 NOTE — Telephone Encounter (Signed)
Patient called asking again about having an HSG. She had one done in 10/21. She stated that she showed AMS the pictures on her phone of the images from the study. I asked if he had seen the report to which she replied no. She is going to attempt to have report faxed over for AMS to review. She would rather not have surgery to remove the scared/blocked left tube.

## 2021-01-20 NOTE — Telephone Encounter (Signed)
Today I rec'd a flash drive that the patient dropped off for my attention. After pulling up her file I realized that it must be images related to the HSG she had in 10/21. I will give this drive to Tonya/HIM to have added to the patient's chart and notify Dr Bonney Aid.

## 2021-01-22 NOTE — Telephone Encounter (Signed)
If she doesn't want it removed there is really no benefit in doing a another HSG in that case we just follow

## 2021-01-27 ENCOUNTER — Ambulatory Visit: Payer: Managed Care, Other (non HMO) | Admitting: Plastic Surgery

## 2021-01-27 ENCOUNTER — Other Ambulatory Visit: Payer: Self-pay

## 2021-01-27 ENCOUNTER — Encounter: Payer: Self-pay | Admitting: Plastic Surgery

## 2021-01-27 VITALS — BP 133/92 | HR 72 | Ht 63.5 in | Wt 176.8 lb

## 2021-01-27 DIAGNOSIS — R1033 Periumbilical pain: Secondary | ICD-10-CM

## 2021-01-27 DIAGNOSIS — M793 Panniculitis, unspecified: Secondary | ICD-10-CM

## 2021-01-27 DIAGNOSIS — Z411 Encounter for cosmetic surgery: Secondary | ICD-10-CM

## 2021-01-27 NOTE — Progress Notes (Signed)
   Referring Provider [facility] [address] [place],  [place] [address]   CC:  Chief Complaint  Patient presents with  . Advice Only      Gina Gardner is an 45 y.o. female.  HPI: Patient presents to discuss her abdomen.  She is lost over 30 pounds on her own through diet and exercise.  She is bothered by her overhanging abdominal skin.  She gets rashes beneath her skin and they have been refractory to over-the-counter treatments.  She also has back pain.  She does not smoke and is not a diabetic.  Prior prior abdominal surgeries include a hernia repair and 4 C-sections.    Outpatient Encounter Medications as of 01/27/2021  Medication Sig Note  . [DISCONTINUED] acetaminophen (TYLENOL) 500 MG tablet Take 500-1,000 mg by mouth every 6 (six) hours as needed (pain.).   . [DISCONTINUED] Biotin 5000 MCG TABS Take 5,000 mcg by mouth daily.   . [DISCONTINUED] clindamycin (CLEOCIN) 300 MG capsule Take 300 mg by mouth 4 (four) times daily. 10/09/2020: 7 day therapy course patient began on 10/07/2020  . [DISCONTINUED] Docosahexaenoic Acid (DOC OMEGA PO) Take 1 capsule by mouth daily. Omega-7    No facility-administered encounter medications on file as of 01/27/2021.     Past Medical History:  Diagnosis Date  . Anemia   . Hypertension   . Polycystic kidney disease     Past Surgical History:  Procedure Laterality Date  . CESAREAN SECTION    . CESAREAN SECTION CLASSICAL     x 4   . PATELLA RECONSTRUCTION      Family History  Problem Relation Age of Onset  . Diabetes Mother   . Hypertension Mother   . Cancer Father     Social History   Social History Narrative  . Not on file     Review of Systems General: Denies fevers, chills, weight loss CV: Denies chest pain, shortness of breath, palpitations  Physical Exam Vitals with BMI 01/27/2021 10/14/2020 10/13/2020  Height 5' 3.5" 5' 3.5" 5' 3.5"  Weight 176 lbs 13 oz 180 lbs 180 lbs  BMI 30.82 31.38  31.38  Systolic 133 - 126  Diastolic 92 - 82  Pulse 72 - -    General:  No acute distress,  Alert and oriented, Non-Toxic, Normal speech and affect Abdomen: Abdomen is soft nontender.  I do not appreciate any hernias.  She appears to have rectus diastases.  She has an overhanging pannus with moderate skin excess in the infra and supraumbilical areas.  She has a lower transverse scar.  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  We discussed panniculectomy.  We discussed the risks and benefits that include bleeding, infection, damage to surrounding structures and need for additional procedures.  I discussed the location and orientation of the scar.  I also discussed adding additional upper abdominal dissection, muscle plication, and liposuction to improve her cosmetic result.  She is interested in this and will plan to provide a quote to her for the additional services and submit the panniculectomy for insurance approval.  All of her questions were answered we will plan to move forward.  I will plan to get a CT scan of the abdomen to evaluate for any hernias as she reports having a hernia repair in the past.  Allena Napoleon 01/27/2021, 9:09 AM

## 2021-04-19 ENCOUNTER — Emergency Department: Payer: Managed Care, Other (non HMO)

## 2021-04-19 ENCOUNTER — Encounter: Payer: Self-pay | Admitting: Physician Assistant

## 2021-04-19 ENCOUNTER — Emergency Department
Admission: EM | Admit: 2021-04-19 | Discharge: 2021-04-19 | Disposition: A | Discharge status: Home / Self Care | Payer: Managed Care, Other (non HMO) | Attending: Emergency Medicine | Admitting: Emergency Medicine

## 2021-04-19 ENCOUNTER — Other Ambulatory Visit: Payer: Self-pay

## 2021-04-19 DIAGNOSIS — S060X9A Concussion with loss of consciousness of unspecified duration, initial encounter: Secondary | ICD-10-CM | POA: Diagnosis not present

## 2021-04-19 DIAGNOSIS — I10 Essential (primary) hypertension: Secondary | ICD-10-CM | POA: Diagnosis not present

## 2021-04-19 DIAGNOSIS — S0990XA Unspecified injury of head, initial encounter: Secondary | ICD-10-CM

## 2021-04-19 DIAGNOSIS — S060X0A Concussion without loss of consciousness, initial encounter: Secondary | ICD-10-CM

## 2021-04-19 DIAGNOSIS — W182XXA Fall in (into) shower or empty bathtub, initial encounter: Secondary | ICD-10-CM | POA: Insufficient documentation

## 2021-04-19 MED ORDER — BACLOFEN 10 MG PO TABS: 10.0000 mg | ORAL_TABLET | Freq: Three times a day (TID) | ORAL | 0 refills | Status: AC

## 2021-04-19 MED ORDER — HYDROCODONE-ACETAMINOPHEN 5-325 MG PO TABS: 1.0000 | ORAL_TABLET | Freq: Four times a day (QID) | ORAL | 0 refills | Status: AC | PRN

## 2021-04-19 NOTE — ED Provider Notes (Signed)
[facility] Emergency Department Provider Note  ____________________________________________   Event Date/Time   First MD Initiated Contact with Patient 04/19/21 1549     (approximate)  I have reviewed the triage vital signs and the nursing notes.   HISTORY  Chief Complaint Head Injury    HPI Gina Gardner is a 45 y.o. female presents emergency department after a fall in the shower.  Patient states she fell late last night in the shower hitting her head and losing consciousness for a few minutes.  States her mother tried to wake her up frequently.  States she continues to have a headache and some nausea.  States she sensitive to light.  No vomiting.  No numbness or tingling or slurred speech.  Past Medical History:  Diagnosis Date   Anemia    Hypertension    Polycystic kidney disease     There are no problems to display for this patient.   Past Surgical History:  Procedure Laterality Date   CESAREAN SECTION     CESAREAN SECTION CLASSICAL     x 4    PATELLA RECONSTRUCTION      Prior to Admission medications   Medication Sig Start Date End Date Taking? Authorizing Provider  baclofen (LIORESAL) 10 MG tablet Take 1 tablet (10 mg total) by mouth 3 (three) times daily for 7 days. 04/19/21 04/26/21 Yes Starnisha Batrez, Roselyn Bering, PA-C  HYDROcodone-acetaminophen (NORCO/VICODIN) 5-325 MG tablet Take 1 tablet by mouth every 6 (six) hours as needed for moderate pain. 04/19/21  Yes Derik Fults, Roselyn Bering, PA-C    Allergies Darvon [propoxyphene], Penicillins, and Shellfish allergy  Family History  Problem Relation Age of Onset   Diabetes Mother    Hypertension Mother    Cancer Father     Social History Social History   Tobacco Use   Smoking status: Never   Smokeless tobacco: Never  Vaping Use   Vaping Use: Never used  Substance Use Topics   Alcohol use: No   Drug use: Never    Review of Systems  Constitutional: No fever/chills Eyes: No visual  changes. ENT: No sore throat. Respiratory: Denies cough Cardiovascular: Denies chest pain Gastrointestinal: Denies abdominal pain Genitourinary: Negative for dysuria. Musculoskeletal: Negative for back pain. Skin: Negative for rash. Psychiatric: no mood changes,     ____________________________________________   PHYSICAL EXAM:  VITAL SIGNS: ED Triage Vitals [04/19/21 1409]  Enc Vitals Group     BP (!) 134/91     Pulse Rate 72     Resp 16     Temp 98.5 F (36.9 C)     Temp Source Oral     SpO2 100 %     Weight 177 lb (80.3 kg)     Height 5\' 3"  (1.6 m)     Head Circumference      Peak Flow      Pain Score 8     Pain Loc      Pain Edu?      Excl. in GC?     Constitutional: Alert and oriented. Well appearing and in no acute distress. Eyes: Conjunctivae are normal.  PERRL, EOMI Head: Left side of skull, TMJ, and left mandible are tender to palpation Nose: No congestion/rhinnorhea. Mouth/Throat: Mucous membranes are moist.  Left upper molar cracked, braces are intact Neck:  supple no lymphadenopathy noted Cardiovascular: Normal rate, regular rhythm. Heart sounds are normal Respiratory: Normal respiratory effort.  No retractions, lungs c t a  GU: deferred Musculoskeletal: FROM all  extremities, warm and well perfused Neurologic:  Normal speech and language.  Cranial nerves II through XII grossly intact Skin:  Skin is warm, dry and intact. No rash noted. Psychiatric: Mood and affect are normal. Speech and behavior are normal.  ____________________________________________   LABS (all labs ordered are listed, but only abnormal results are displayed)  Labs Reviewed - No data to display ____________________________________________   ____________________________________________  RADIOLOGY  CT of the head, CT maxillofacial, CT of the C-spine   ____________________________________________   PROCEDURES  Procedure(s) performed:  No  Procedures    ____________________________________________   INITIAL IMPRESSION / ASSESSMENT AND PLAN / ED COURSE  Pertinent labs & imaging results that were available during my care of the patient were reviewed by me and considered in my medical decision making (see chart for details).   Patient is a 45 year old female presents with head injury.  See HPI.  Physical exam shows patient appears stable.  CT of the head, C-spine, and maxillofacial are all negative for any acute abnormality.  These reviewed by me confirmed by radiology  I did explain all the findings to the patient.  I did offer pain medication while here, she states she is driving and would just rather take Tylenol at this time.  She was given concussion instructions.  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  She is to take Tylenol and ibuprofen for pain as needed.  She was given a muscle relaxer and narcotic pain medication.  Patient states she most likely will not get the narcotic pain medication filled as she does not like taking pills.  I told her she have a pharmacy hold that medication.  She is to return emergency department if worsening.  She was discharged stable condition.     Gina Gardner was evaluated in Emergency Department on 04/19/2021 for the symptoms described in the history of present illness. She was evaluated in the context of the global COVID-19 pandemic, which necessitated consideration that the patient might be at risk for infection with the SARS-CoV-2 virus that causes COVID-19. Institutional protocols and algorithms that pertain to the evaluation of patients at risk for COVID-19 are in a state of rapid change based on information released by regulatory bodies including the CDC and federal and state organizations. These policies and algorithms were followed during the patient's care in the ED.    As part of my medical decision  making, I reviewed the following data within the electronic MEDICAL RECORD NUMBER Nursing notes reviewed and incorporated, Old chart reviewed, Radiograph reviewed , Notes from prior ED visits, and [place] Controlled Substance Database  ____________________________________________   FINAL CLINICAL IMPRESSION(S) / ED DIAGNOSES  Final diagnoses:  Injury of head, initial encounter  Concussion without loss of consciousness, initial encounter      NEW MEDICATIONS STARTED DURING THIS VISIT:  Discharge Medication List as of 04/19/2021  5:13 PM     START taking these medications   Details  baclofen (LIORESAL) 10 MG tablet Take 1 tablet (10 mg total) by mouth 3 (three) times daily for 7 days., Starting Mon 04/19/2021, Until Mon 04/26/2021, Print    HYDROcodone-acetaminophen (NORCO/VICODIN) 5-325 MG tablet Take 1 tablet by mouth every 6 (six) hours as needed for moderate pain., Starting Mon 04/19/2021, Print         Note:  This document was prepared using Dragon voice recognition software and may include  unintentional dictation errors.    Faythe Ghee, PA-C 04/19/21 Lynelle Smoke    Phineas Semen, MD 04/19/21 2012

## 2021-04-19 NOTE — ED Triage Notes (Signed)
Pt here with a head injury that occurred yesterday. Pt fell and hit her head and passed out for a few mins but then recovered. Pt here to be evaluated.e

## 2021-04-19 NOTE — ED Notes (Signed)
See triage note  Presents s/p fall  States she fell in the shower hitting her head  Having pain to left side of head and neck

## 2021-04-19 NOTE — Discharge Instructions (Addendum)
Follow up with your regular doctor if not better in 3 to 5 days, return to the ER if worsening Use the medication as needed, baclofen is for muscle pain, vicodin is for pain not controlled by tylenol or ibuprofen Limit blue light exposure from cell phones, computers, televesions to 2 hours total per day for the next 3 days Rest and sleep will help with the headache Follow up with your dentist and orthodontist to evaluate chipped tooth and braces

## 2021-04-23 ENCOUNTER — Other Ambulatory Visit: Payer: Self-pay | Admitting: Pediatrics

## 2021-04-23 ENCOUNTER — Other Ambulatory Visit (HOSPITAL_COMMUNITY): Payer: Self-pay | Admitting: Pediatrics

## 2021-04-23 DIAGNOSIS — E040 Nontoxic diffuse goiter: Secondary | ICD-10-CM

## 2021-04-23 DIAGNOSIS — E049 Nontoxic goiter, unspecified: Secondary | ICD-10-CM

## 2021-05-12 ENCOUNTER — Ambulatory Visit: Payer: Managed Care, Other (non HMO) | Attending: Pediatrics

## 2021-09-24 ENCOUNTER — Other Ambulatory Visit: Payer: Self-pay | Admitting: Pediatrics

## 2021-09-24 DIAGNOSIS — Q613 Polycystic kidney, unspecified: Secondary | ICD-10-CM

## 2021-09-24 DIAGNOSIS — K5900 Constipation, unspecified: Secondary | ICD-10-CM

## 2021-09-24 DIAGNOSIS — N7011 Chronic salpingitis: Secondary | ICD-10-CM

## 2021-09-24 DIAGNOSIS — R1032 Left lower quadrant pain: Secondary | ICD-10-CM

## 2021-10-01 ENCOUNTER — Other Ambulatory Visit: Payer: Self-pay

## 2021-10-01 ENCOUNTER — Ambulatory Visit
Admission: RE | Admit: 2021-10-01 | Discharge: 2021-10-01 | Disposition: A | Discharge status: Home / Self Care | Payer: Managed Care, Other (non HMO) | Source: Ambulatory Visit | Attending: Pediatrics | Admitting: Pediatrics

## 2021-10-01 DIAGNOSIS — N7011 Chronic salpingitis: Secondary | ICD-10-CM | POA: Insufficient documentation

## 2021-10-01 DIAGNOSIS — Q613 Polycystic kidney, unspecified: Secondary | ICD-10-CM | POA: Diagnosis present

## 2021-10-01 DIAGNOSIS — K5900 Constipation, unspecified: Secondary | ICD-10-CM

## 2021-10-01 DIAGNOSIS — R1032 Left lower quadrant pain: Secondary | ICD-10-CM

## 2022-06-03 IMAGING — CT CT CERVICAL SPINE W/O CM
2 series · 14 of 27 positions shown, 18 images · non-contrast
Comparison: CT head 04/19/2021

CLINICAL DATA: Status post fall

EXAM:
CT MAXILLOFACIAL WITHOUT CONTRAST
CT CERVICAL SPINE WITHOUT CONTRAST
TECHNIQUE: Multidetector CT imaging of the maxillofacial structures was
performed. Multiplanar CT image reconstructions were also generated.
A small metallic BB was placed on the right temple in order to
reliably differentiate right from left.
Multidetector CT imaging of the cervical spine was performed without
intravenous contrast. Multiplanar CT image reconstructions were also
generated.

[Series 3: c spine soft · axial · 0.33mm/px · z∈[-265,-149]mm · 9 of 70 slices shown, 12 images]
[im 6/70  soft-tissue]
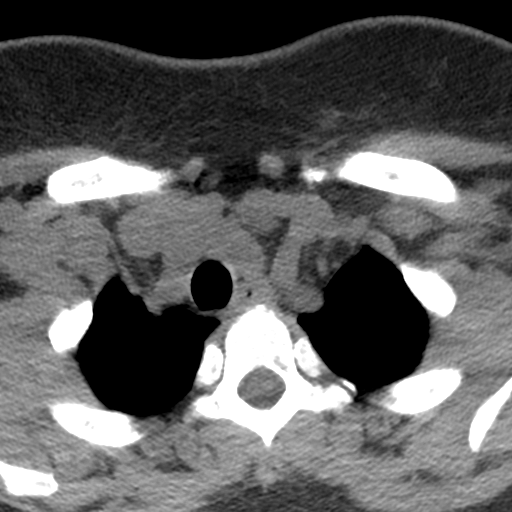
[im 6/70  bone]
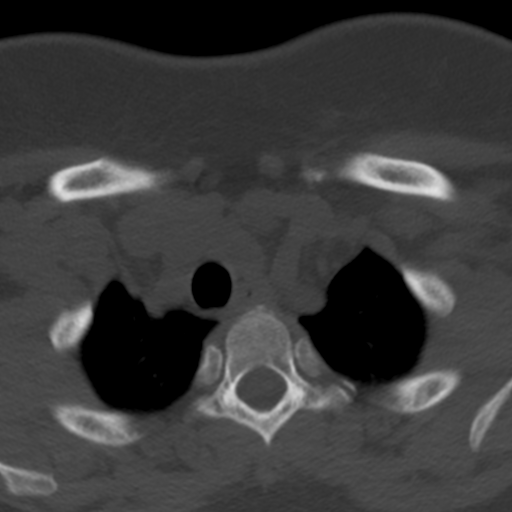
[im 16/70  bone]
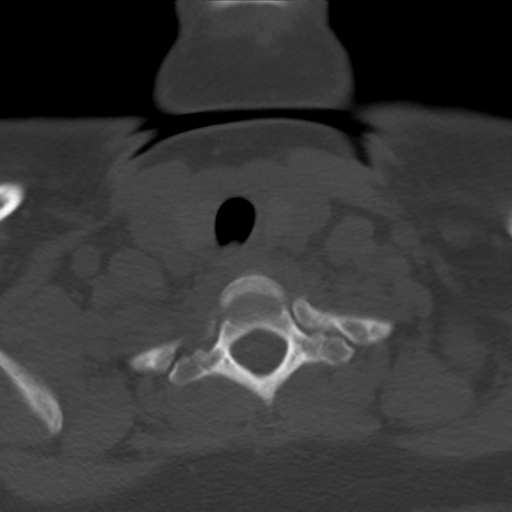
[im 22/70  bone]
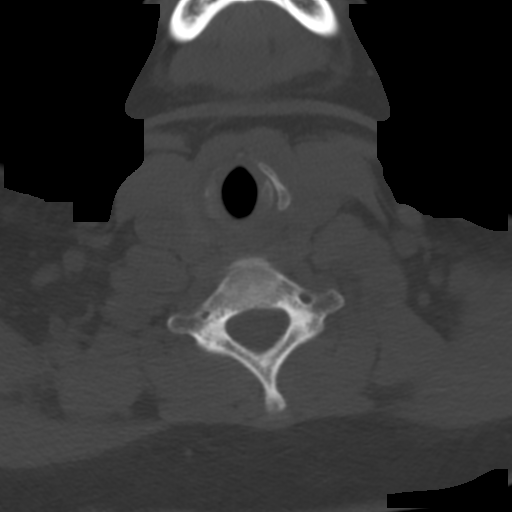
[im 27/70  bone]
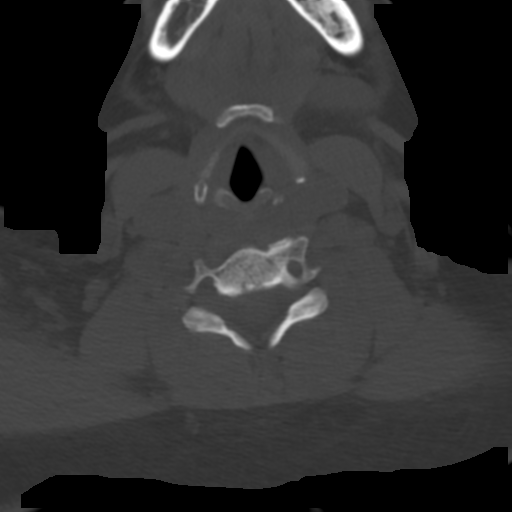
[im 38/70  soft-tissue]
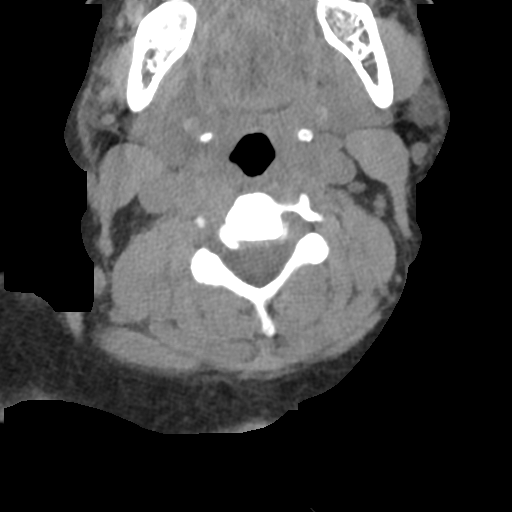
[im 38/70  bone]
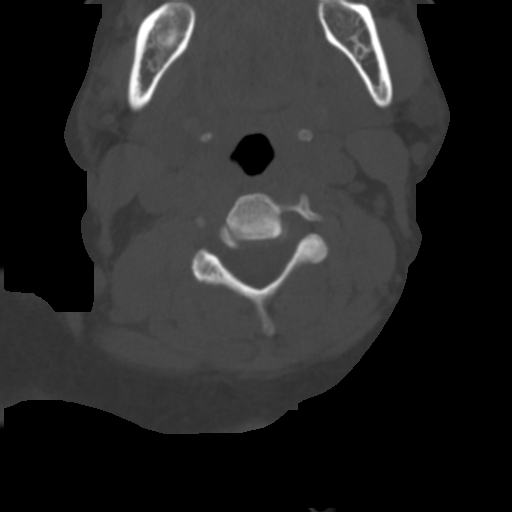
[im 43/70  bone]
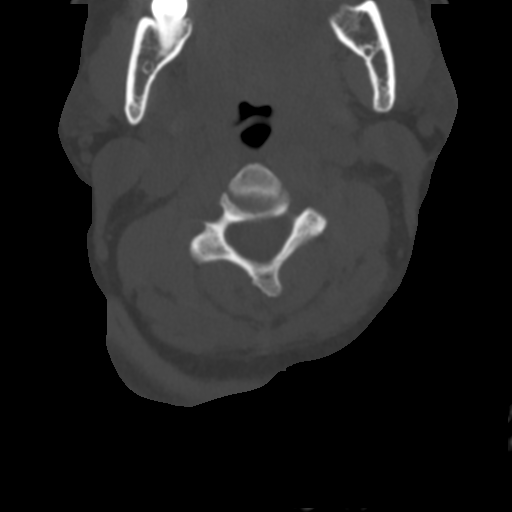
[im 48/70  bone]
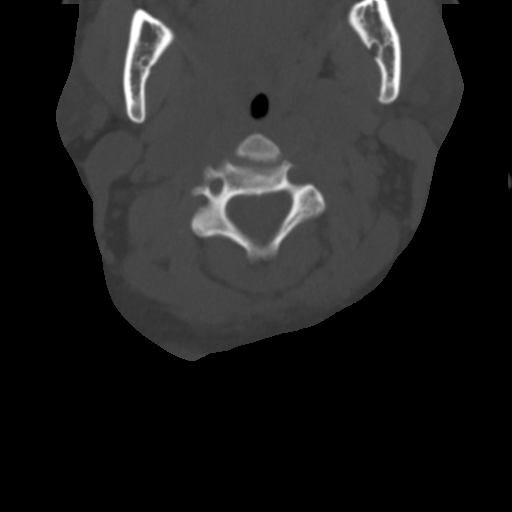
[im 59/70  bone]
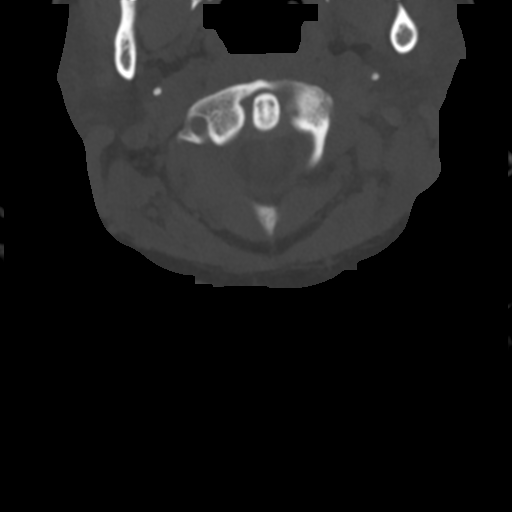
[im 64/70  soft-tissue]
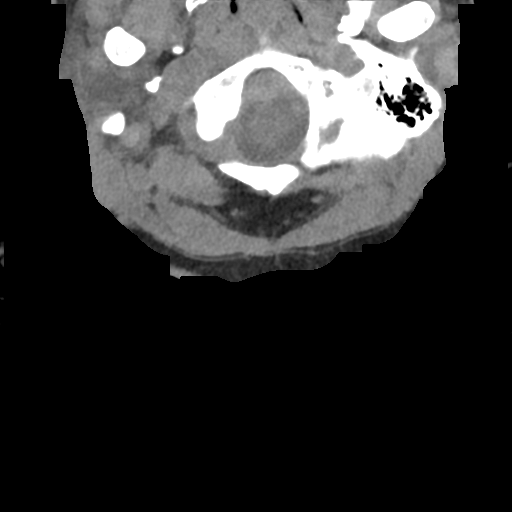
[im 64/70  bone]
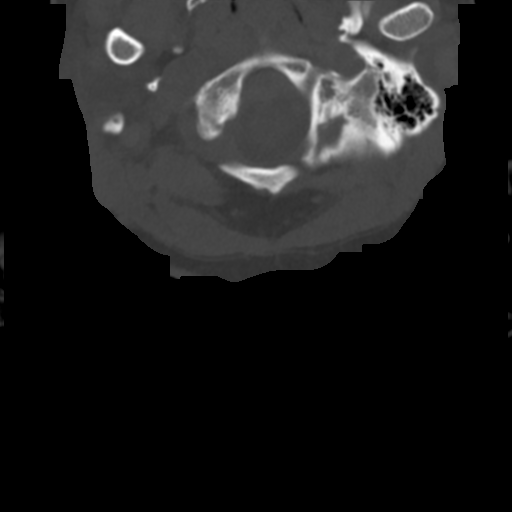

[Series 5: sagittal bone · sagittal · 0.30mm/px · 5 of 67 slices shown, 6 images]
[im 23/67  bone]
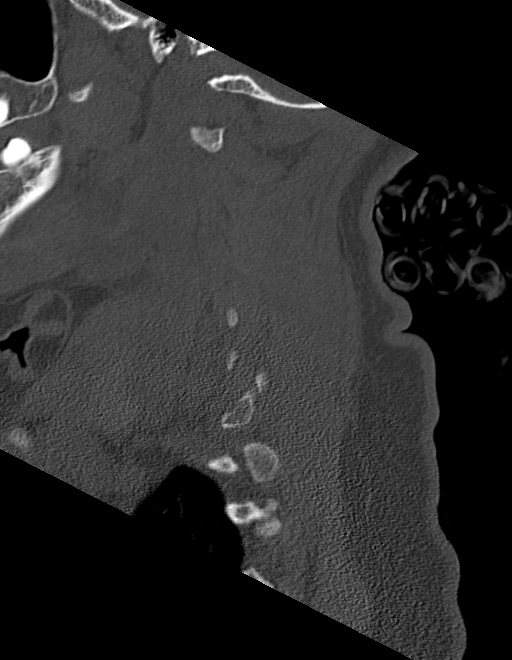
[im 28/67  bone]
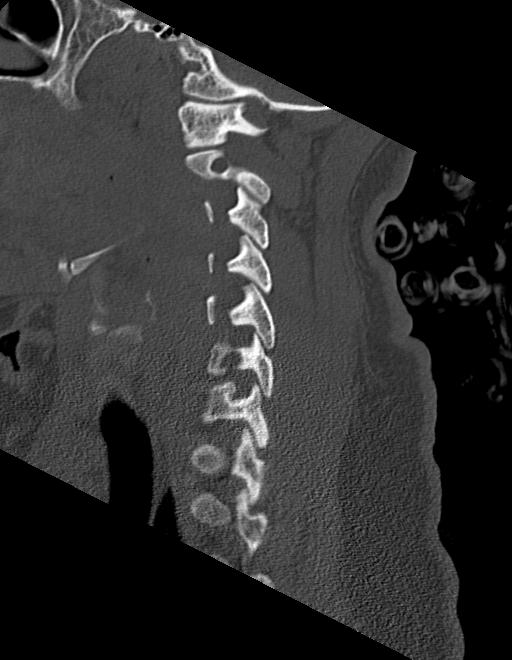
[im 34/67  soft-tissue]
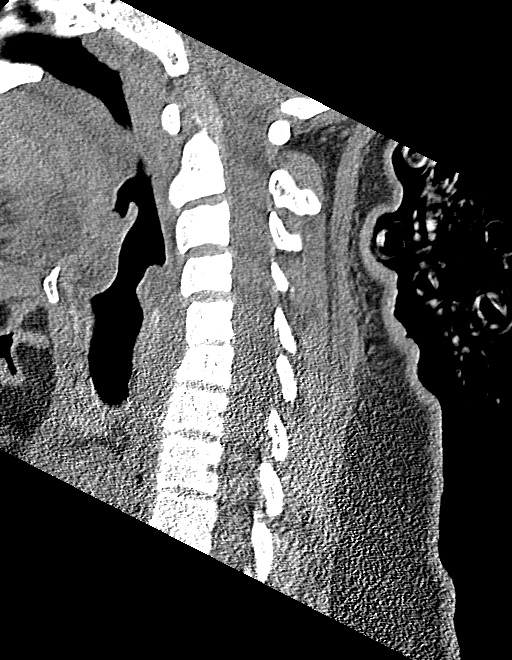
[im 34/67  bone]
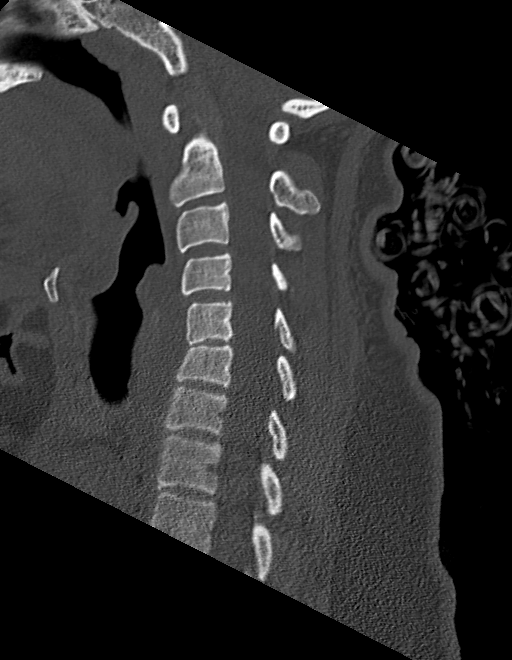
[im 39/67  bone]
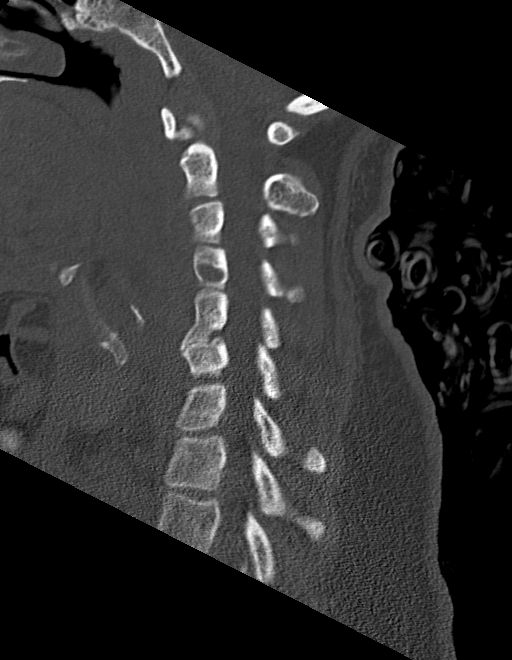
[im 45/67  bone]
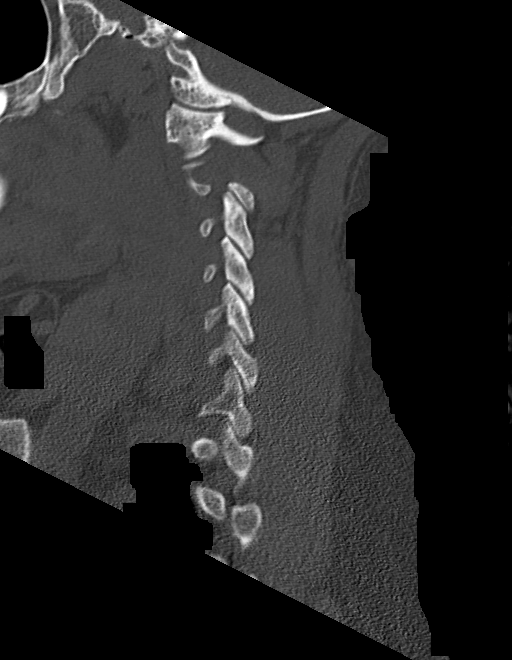

[14 of 27 positions shown; findings below may reference images not displayed]

FINDINGS: CT MAXILLOFACIAL FINDINGS

Osseous: No fracture or mandibular dislocation. No destructive
process.

Orbits/sinuses: Sinuses/Orbits: Paranasal sinuses and mastoid air
cells are clear. The orbits are unremarkable.

Soft tissues: Negative.

Limited intracranial: No acute abnormality. Please see separately
dictated CT head 04/19/2021.

CT CERVICAL FINDINGS

Alignment: Reversal of the normal cervical lordosis likely due to
positioning and degenerative changes.

Skull base and vertebrae: Degenerative changes at the C5-C6 level.
No acute fracture. No aggressive appearing focal osseous lesion or
focal pathologic process.

Soft tissues and spinal canal: No prevertebral fluid or swelling. No
visible canal hematoma.

Upper chest: Unremarkable.

Other: None.
IMPRESSION: 1. No acute displaced facial fracture.
2. No acute displaced fracture or traumatic listhesis of the
cervical spine.

## 2023-11-02 ENCOUNTER — Other Ambulatory Visit: Payer: Self-pay | Admitting: Pediatrics

## 2023-11-02 DIAGNOSIS — E040 Nontoxic diffuse goiter: Secondary | ICD-10-CM

## 2023-11-08 ENCOUNTER — Ambulatory Visit: Admission: RE | Admit: 2023-11-08 | Payer: Managed Care, Other (non HMO) | Source: Ambulatory Visit

## 2023-11-10 ENCOUNTER — Ambulatory Visit
Admission: RE | Admit: 2023-11-10 | Discharge: 2023-11-10 | Disposition: A | Discharge status: Home / Self Care | Payer: Managed Care, Other (non HMO) | Source: Ambulatory Visit | Attending: Pediatrics | Admitting: Pediatrics

## 2023-11-10 DIAGNOSIS — E040 Nontoxic diffuse goiter: Secondary | ICD-10-CM | POA: Diagnosis present

## 2024-01-30 ENCOUNTER — Other Ambulatory Visit: Payer: Self-pay | Admitting: Pediatrics

## 2024-01-30 DIAGNOSIS — Z1231 Encounter for screening mammogram for malignant neoplasm of breast: Secondary | ICD-10-CM

## 2024-02-07 ENCOUNTER — Ambulatory Visit
Admission: RE | Admit: 2024-02-07 | Discharge: 2024-02-07 | Disposition: A | Discharge status: Home / Self Care | Source: Ambulatory Visit | Attending: Pediatrics | Admitting: Pediatrics

## 2024-02-07 DIAGNOSIS — Z1231 Encounter for screening mammogram for malignant neoplasm of breast: Secondary | ICD-10-CM | POA: Diagnosis present

## 2024-02-13 ENCOUNTER — Other Ambulatory Visit: Payer: Self-pay | Admitting: Pediatrics

## 2024-02-13 DIAGNOSIS — R928 Other abnormal and inconclusive findings on diagnostic imaging of breast: Secondary | ICD-10-CM

## 2024-02-15 ENCOUNTER — Ambulatory Visit
Admission: RE | Admit: 2024-02-15 | Discharge: 2024-02-15 | Disposition: A | Discharge status: Home / Self Care | Source: Ambulatory Visit | Attending: Pediatrics | Admitting: Pediatrics

## 2024-02-15 ENCOUNTER — Other Ambulatory Visit: Payer: Self-pay | Admitting: Pediatrics

## 2024-02-15 DIAGNOSIS — R921 Mammographic calcification found on diagnostic imaging of breast: Secondary | ICD-10-CM

## 2024-02-15 DIAGNOSIS — R928 Other abnormal and inconclusive findings on diagnostic imaging of breast: Secondary | ICD-10-CM

## 2024-02-19 ENCOUNTER — Other Ambulatory Visit: Payer: Self-pay | Admitting: Pediatrics

## 2024-02-19 DIAGNOSIS — R928 Other abnormal and inconclusive findings on diagnostic imaging of breast: Secondary | ICD-10-CM

## 2024-02-19 DIAGNOSIS — R921 Mammographic calcification found on diagnostic imaging of breast: Secondary | ICD-10-CM

## 2024-02-19 DIAGNOSIS — N63 Unspecified lump in unspecified breast: Secondary | ICD-10-CM

## 2024-02-22 ENCOUNTER — Ambulatory Visit
Admission: RE | Admit: 2024-02-22 | Discharge: 2024-02-22 | Disposition: A | Discharge status: Home / Self Care | Source: Ambulatory Visit | Attending: Pediatrics | Admitting: Pediatrics

## 2024-02-22 DIAGNOSIS — R928 Other abnormal and inconclusive findings on diagnostic imaging of breast: Secondary | ICD-10-CM | POA: Insufficient documentation

## 2024-02-22 DIAGNOSIS — D241 Benign neoplasm of right breast: Secondary | ICD-10-CM | POA: Insufficient documentation

## 2024-02-22 DIAGNOSIS — R921 Mammographic calcification found on diagnostic imaging of breast: Secondary | ICD-10-CM

## 2024-02-22 DIAGNOSIS — N63 Unspecified lump in unspecified breast: Secondary | ICD-10-CM | POA: Insufficient documentation

## 2024-02-22 DIAGNOSIS — N6031 Fibrosclerosis of right breast: Secondary | ICD-10-CM | POA: Diagnosis present

## 2024-02-22 HISTORY — PX: BREAST BIOPSY: SHX20

## 2024-02-22 MED ORDER — CHLOROPROCAINE HCL (PF) 3 % IJ SOLN
5.0000 mL | Freq: Once | INTRAMUSCULAR | Status: AC
  Administered 2024-02-22: 150 mg
  Filled 2024-02-22: qty 5

## 2024-02-23 LAB — SURGICAL PATHOLOGY

## 2024-07-03 ENCOUNTER — Other Ambulatory Visit: Payer: Self-pay | Admitting: Medical Genetics

## 2024-07-04 ENCOUNTER — Other Ambulatory Visit

## 2024-07-05 ENCOUNTER — Other Ambulatory Visit

## 2024-10-29 ENCOUNTER — Other Ambulatory Visit: Payer: Self-pay | Admitting: Medical Genetics

## 2024-10-29 DIAGNOSIS — Z006 Encounter for examination for normal comparison and control in clinical research program: Secondary | ICD-10-CM

## 2024-11-20 LAB — GENECONNECT MOLECULAR SCREEN: Genetic Analysis Overall Interpretation: NEGATIVE
